# Patient Record
Sex: Female | Born: 1965 | Race: White | Hispanic: No | Marital: Married | Smoking: Current every day smoker
Health system: Southern US, Community
[De-identification: ages and names within clinical notes are randomized; demographics above are authoritative.]

## PROBLEM LIST (undated history)

## (undated) DIAGNOSIS — I10 Essential (primary) hypertension: Secondary | ICD-10-CM

## (undated) DIAGNOSIS — Q211 Atrial septal defect: Secondary | ICD-10-CM

## (undated) DIAGNOSIS — Q2112 Patent foramen ovale: Secondary | ICD-10-CM

## (undated) DIAGNOSIS — R0789 Other chest pain: Secondary | ICD-10-CM

## (undated) DIAGNOSIS — I340 Nonrheumatic mitral (valve) insufficiency: Secondary | ICD-10-CM

## (undated) DIAGNOSIS — I77810 Thoracic aortic ectasia: Secondary | ICD-10-CM

## (undated) DIAGNOSIS — G459 Transient cerebral ischemic attack, unspecified: Secondary | ICD-10-CM

## (undated) DIAGNOSIS — E785 Hyperlipidemia, unspecified: Secondary | ICD-10-CM

## (undated) HISTORY — DX: Other chest pain: R07.89

## (undated) HISTORY — PX: OOPHORECTOMY: SHX86

## (undated) HISTORY — PX: INSERTION OF MESH: SHX5868

## (undated) HISTORY — DX: Thoracic aortic ectasia: I77.810

## (undated) HISTORY — PX: ABDOMINAL HYSTERECTOMY: SHX81

## (undated) HISTORY — DX: Patent foramen ovale: Q21.12

## (undated) HISTORY — PX: ABDOMINAL SURGERY: SHX537

## (undated) HISTORY — PX: ECTOPIC PREGNANCY SURGERY: SHX613

## (undated) HISTORY — DX: Atrial septal defect: Q21.1

## (undated) HISTORY — DX: Transient cerebral ischemic attack, unspecified: G45.9

---
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
## 2000-03-12 ENCOUNTER — Encounter: Payer: Self-pay | Admitting: Emergency Medicine

## 2000-03-12 ENCOUNTER — Emergency Department (HOSPITAL_COMMUNITY): Admission: EM | Admit: 2000-03-12 | Discharge: 2000-03-12 | Payer: Self-pay | Admitting: Emergency Medicine

## 2001-02-27 ENCOUNTER — Emergency Department (HOSPITAL_COMMUNITY): Admission: EM | Admit: 2001-02-27 | Discharge: 2001-02-27 | Payer: Self-pay

## 2001-11-26 ENCOUNTER — Emergency Department (HOSPITAL_COMMUNITY): Admission: EM | Admit: 2001-11-26 | Discharge: 2001-11-26 | Payer: Self-pay | Admitting: Emergency Medicine

## 2001-11-26 ENCOUNTER — Encounter: Payer: Self-pay | Admitting: Emergency Medicine

## 2008-07-10 ENCOUNTER — Ambulatory Visit (HOSPITAL_COMMUNITY): Admission: RE | Admit: 2008-07-10 | Discharge: 2008-07-10 | Payer: Self-pay | Admitting: Cardiology

## 2014-07-08 DIAGNOSIS — G459 Transient cerebral ischemic attack, unspecified: Secondary | ICD-10-CM

## 2014-07-08 HISTORY — DX: Transient cerebral ischemic attack, unspecified: G45.9

## 2014-11-24 ENCOUNTER — Ambulatory Visit: Payer: Self-pay | Admitting: Neurology

## 2014-12-02 ENCOUNTER — Ambulatory Visit: Payer: Self-pay | Admitting: Neurology

## 2014-12-08 ENCOUNTER — Ambulatory Visit: Payer: Self-pay | Admitting: Neurology

## 2014-12-09 ENCOUNTER — Encounter: Payer: Self-pay | Admitting: Neurology

## 2015-06-27 ENCOUNTER — Emergency Department (HOSPITAL_COMMUNITY): Payer: Self-pay

## 2015-06-27 ENCOUNTER — Emergency Department (HOSPITAL_COMMUNITY)
Admission: EM | Admit: 2015-06-27 | Discharge: 2015-06-27 | Disposition: A | Discharge status: Home / Self Care | Payer: Self-pay | Attending: Emergency Medicine | Admitting: Emergency Medicine

## 2015-06-27 ENCOUNTER — Encounter (HOSPITAL_COMMUNITY): Payer: Self-pay

## 2015-06-27 DIAGNOSIS — Z79899 Other long term (current) drug therapy: Secondary | ICD-10-CM | POA: Insufficient documentation

## 2015-06-27 DIAGNOSIS — I10 Essential (primary) hypertension: Secondary | ICD-10-CM | POA: Insufficient documentation

## 2015-06-27 DIAGNOSIS — R109 Unspecified abdominal pain: Secondary | ICD-10-CM

## 2015-06-27 DIAGNOSIS — F17200 Nicotine dependence, unspecified, uncomplicated: Secondary | ICD-10-CM | POA: Insufficient documentation

## 2015-06-27 DIAGNOSIS — Z8673 Personal history of transient ischemic attack (TIA), and cerebral infarction without residual deficits: Secondary | ICD-10-CM | POA: Insufficient documentation

## 2015-06-27 DIAGNOSIS — R1012 Left upper quadrant pain: Secondary | ICD-10-CM | POA: Insufficient documentation

## 2015-06-27 HISTORY — DX: Essential (primary) hypertension: I10

## 2015-06-27 HISTORY — DX: Nonrheumatic mitral (valve) insufficiency: I34.0

## 2015-06-27 LAB — URINALYSIS, ROUTINE W REFLEX MICROSCOPIC
Bilirubin Urine: NEGATIVE
GLUCOSE, UA: NEGATIVE mg/dL
HGB URINE DIPSTICK: NEGATIVE
KETONES UR: NEGATIVE mg/dL
LEUKOCYTES UA: NEGATIVE
Nitrite: NEGATIVE
PROTEIN: NEGATIVE mg/dL
Specific Gravity, Urine: 1.008 (ref 1.005–1.030)
pH: 6.5 (ref 5.0–8.0)

## 2015-06-27 LAB — CBC WITH DIFFERENTIAL/PLATELET
Basophils Absolute: 0 10*3/uL (ref 0.0–0.1)
Basophils Relative: 0 %
Eosinophils Absolute: 0.2 10*3/uL (ref 0.0–0.7)
Eosinophils Relative: 2 %
HEMATOCRIT: 42.6 % (ref 36.0–46.0)
Hemoglobin: 14.3 g/dL (ref 12.0–15.0)
LYMPHS ABS: 2.1 10*3/uL (ref 0.7–4.0)
LYMPHS PCT: 23 %
MCH: 29.5 pg (ref 26.0–34.0)
MCHC: 33.6 g/dL (ref 30.0–36.0)
MCV: 88 fL (ref 78.0–100.0)
MONO ABS: 0.7 10*3/uL (ref 0.1–1.0)
MONOS PCT: 7 %
NEUTROS ABS: 6.3 10*3/uL (ref 1.7–7.7)
Neutrophils Relative %: 68 %
Platelets: 324 10*3/uL (ref 150–400)
RBC: 4.84 MIL/uL (ref 3.87–5.11)
RDW: 13.3 % (ref 11.5–15.5)
WBC: 9.2 10*3/uL (ref 4.0–10.5)

## 2015-06-27 LAB — COMPREHENSIVE METABOLIC PANEL
ALT: 24 U/L (ref 14–54)
AST: 18 U/L (ref 15–41)
Albumin: 4.2 g/dL (ref 3.5–5.0)
Alkaline Phosphatase: 61 U/L (ref 38–126)
Anion gap: 7 (ref 5–15)
BILIRUBIN TOTAL: 0.6 mg/dL (ref 0.3–1.2)
BUN: 10 mg/dL (ref 6–20)
CO2: 27 mmol/L (ref 22–32)
CREATININE: 0.82 mg/dL (ref 0.44–1.00)
Calcium: 9.3 mg/dL (ref 8.9–10.3)
Chloride: 105 mmol/L (ref 101–111)
GFR calc Af Amer: >60 mL/min (ref >60)
Glucose, Bld: 86 mg/dL (ref 65–99)
POTASSIUM: 4 mmol/L (ref 3.5–5.1)
Sodium: 139 mmol/L (ref 135–145)
TOTAL PROTEIN: 7.9 g/dL (ref 6.5–8.1)

## 2015-06-27 LAB — I-STAT CG4 LACTIC ACID, ED: Lactic Acid, Venous: 0.72 mmol/L (ref 0.5–2.0)

## 2015-06-27 LAB — LIPASE, BLOOD: LIPASE: 73 U/L — AB (ref 11–51)

## 2015-06-27 MED ORDER — SODIUM CHLORIDE 0.9 % IV BOLUS (SEPSIS)
500.0000 mL | Freq: Once | INTRAVENOUS | Status: AC
  Administered 2015-06-27: 500 mL via INTRAVENOUS

## 2015-06-27 MED ORDER — HYDROMORPHONE HCL 1 MG/ML IJ SOLN
0.5000 mg | Freq: Once | INTRAMUSCULAR | Status: AC
  Administered 2015-06-27: 0.5 mg via INTRAVENOUS
  Filled 2015-06-27: qty 1

## 2015-06-27 MED ORDER — ONDANSETRON HCL 4 MG/2ML IJ SOLN
4.0000 mg | Freq: Once | INTRAMUSCULAR | Status: AC
  Administered 2015-06-27: 4 mg via INTRAVENOUS
  Filled 2015-06-27: qty 2

## 2015-06-27 MED ORDER — DIATRIZOATE MEGLUMINE & SODIUM 66-10 % PO SOLN: 30.0000 mL | Freq: Once | ORAL | Status: DC

## 2015-06-27 MED ORDER — IOPAMIDOL (ISOVUE-300) INJECTION 61%
100.0000 mL | Freq: Once | INTRAVENOUS | Status: AC | PRN
  Administered 2015-06-27: 100 mL via INTRAVENOUS

## 2015-06-27 NOTE — ED Notes (Signed)
Pt has hx of hernia with mesh.  Pt woke up Friday with abdominal pain.  Yesterday, noted bulging in abdomen with increased pain.  Nausea.  No emesis.

## 2015-06-27 NOTE — Discharge Instructions (Signed)
You were seen and evaluated today for your abdominal pain. The exact cause is unclear at this time. Please follow-up with your primary care physician for reevaluation and further workup.  Abdominal Pain, Adult Many things can cause abdominal pain. Usually, abdominal pain is not caused by a disease and will improve without treatment. It can often be observed and treated at home. Your health care provider will do a physical exam and possibly order blood tests and X-rays to help determine the seriousness of your pain. However, in many cases, more time must pass before a clear cause of the pain can be found. Before that point, your health care provider may not know if you need more testing or further treatment. HOME CARE INSTRUCTIONS Monitor your abdominal pain for any changes. The following actions may help to alleviate any discomfort you are experiencing:  Only take over-the-counter or prescription medicines as directed by your health care provider.  Do not take laxatives unless directed to do so by your health care provider.  Try a clear liquid diet (broth, tea, or water) as directed by your health care provider. Slowly move to a bland diet as tolerated. SEEK MEDICAL CARE IF:  You have unexplained abdominal pain.  You have abdominal pain associated with nausea or diarrhea.  You have pain when you urinate or have a bowel movement.  You experience abdominal pain that wakes you in the night.  You have abdominal pain that is worsened or improved by eating food.  You have abdominal pain that is worsened with eating fatty foods.  You have a fever. SEEK IMMEDIATE MEDICAL CARE IF:  Your pain does not go away within 2 hours.  You keep throwing up (vomiting).  Your pain is felt only in portions of the abdomen, such as the right side or the left lower portion of the abdomen.  You pass bloody or black tarry stools. MAKE SURE YOU:  Understand these instructions.  Will watch your  condition.  Will get help right away if you are not doing well or get worse.   This information is not intended to replace advice given to you by your health care provider. Make sure you discuss any questions you have with your health care provider.   Document Released: 11/02/2004 Document Revised: 10/14/2014 Document Reviewed: 10/02/2012 Elsevier Interactive Patient Education [employer]

## 2015-06-27 NOTE — ED Provider Notes (Signed)
CSN: [id number]     Arrival date & time 06/27/15  1106 History   First MD Initiated Contact with Patient 06/27/15 1208     Chief Complaint  Patient presents with  . Abdominal Pain  . Hernia     (Consider location/radiation/quality/duration/timing/severity/associated sxs/prior Treatment) HPI Comments: 50 year old female with history of multiple abdominal surgeries presents for abdominal pain. The patient reports that over the last 3 or so days she has had pain in her abdomen. The pain has come and gone somewhat. Yesterday she felt the pain increase and thought she had a bulging in her left upper abdomen. She's also had associated nausea without any emesis. She has been passing gas as well as having bowel movements. No fevers or chills. She was discussing these symptoms today with nurse friend at church who said she should come to the emergency department because she could have an incarcerated hernia. She is still eating and drinking without any issue.  Patient is a 50 y.o. female presenting with abdominal pain.  Abdominal Pain Associated symptoms: nausea   Associated symptoms: no chest pain, no chills, no cough, no diarrhea, no dysuria, no fatigue, no fever, no shortness of breath and no vomiting     Past Medical History  Diagnosis Date  . Heart murmur   . Mitral valve regurgitation   . Stroke (HCC)   . Hypertension    Past Surgical History  Procedure Laterality Date  . Abdominal surgery    . Insertion of mesh    . Abdominal hysterectomy    . Ectopic pregnancy surgery    . Oophorectomy     History reviewed. No pertinent family history. Social History  Substance Use Topics  . Smoking status: Current Every Day Smoker  . Smokeless tobacco: None  . Alcohol Use: No   OB History    No data available     Review of Systems  Constitutional: Negative for fever, chills and fatigue.  HENT: Negative for congestion, rhinorrhea and sinus pressure.   Eyes: Negative for visual  disturbance.  Respiratory: Negative for cough, chest tightness and shortness of breath.   Cardiovascular: Negative for chest pain and palpitations.  Gastrointestinal: Positive for nausea and abdominal pain. Negative for vomiting and diarrhea.  Genitourinary: Negative for dysuria, urgency and frequency.  Musculoskeletal: Negative for myalgias and back pain.  Skin: Negative for rash.  Neurological: Negative for dizziness, weakness, light-headedness and headaches.  Hematological: Does not bruise/bleed easily.      Allergies  Aspirin  Home Medications   Prior to Admission medications   Medication Sig Start Date End Date Taking? Authorizing Provider  clopidogrel (PLAVIX) 75 MG tablet Take 75 mg by mouth daily.   Yes Historical Provider, MD  metoprolol tartrate (LOPRESSOR) 25 MG tablet Take 25 mg by mouth 2 (two) times daily.   Yes Historical Provider, MD   BP 115/70 mmHg  Pulse 67  Temp(Src) 98.4 F (36.9 C) (Oral)  Resp 13  SpO2 97% Physical Exam  Constitutional: She is oriented to person, place, and time. She appears well-developed and well-nourished. No distress.  HENT:  Head: Normocephalic and atraumatic.  Right Ear: External ear normal.  Left Ear: External ear normal.  Nose: Nose normal.  Mouth/Throat: Oropharynx is clear and moist. No oropharyngeal exudate.  Eyes: EOM are normal. Pupils are equal, round, and reactive to light.  Neck: Normal range of motion. Neck supple.  Cardiovascular: Normal rate, regular rhythm, normal heart sounds and intact distal pulses.   No murmur heard.  Pulmonary/Chest: Effort normal. No respiratory distress. She has no wheezes. She has no rales.  Abdominal: Soft. Normal appearance and bowel sounds are normal. She exhibits no distension. There is tenderness. There is no CVA tenderness. No hernia. Hernia confirmed negative in the ventral area, confirmed negative in the right inguinal area and confirmed negative in the left inguinal area.     Musculoskeletal: Normal range of motion. She exhibits no edema or tenderness.  Neurological: She is alert and oriented to person, place, and time.  Skin: Skin is warm and dry. No rash noted. She is not diaphoretic.  Vitals reviewed.   ED Course  Procedures (including critical care time) Labs Review Labs Reviewed  LIPASE, BLOOD - Abnormal; Notable for the following:    Lipase 73 (*)    All other components within normal limits  CBC WITH DIFFERENTIAL/PLATELET  COMPREHENSIVE METABOLIC PANEL  URINALYSIS, ROUTINE W REFLEX MICROSCOPIC (NOT AT [facility])  I-STAT CG4 LACTIC ACID, ED  I-STAT CG4 LACTIC ACID, ED    Imaging Review Ct Abdomen Pelvis W Contrast  06/27/2015  CLINICAL DATA:  Abdominal pain since Friday. Abdominal bulging with increased pain. Nausea. Per the emergency room physician, patient describes a palpable bulge in the left upper quadrant. EXAM: CT ABDOMEN AND PELVIS WITH CONTRAST TECHNIQUE: Multidetector CT imaging of the abdomen and pelvis was performed using the standard protocol following bolus administration of intravenous contrast. CONTRAST:  ISOVUE-300 IOPAMIDOL (ISOVUE-300) INJECTION 61% COMPARISON:  None. FINDINGS: Lower chest:  No acute findings. Hepatobiliary: No masses or other significant abnormality. Pancreas: No mass, inflammatory changes, or other significant abnormality. Spleen: Within normal limits in size and appearance. Adrenals/Urinary Tract: No masses identified. No evidence of hydronephrosis. Stomach/Bowel: Fairly extensive diverticulosis within the sigmoid and descending colon but no focal inflammatory change to suggest acute diverticulitis. No large or small bowel dilatation. No evidence of bowel wall inflammation. Appendix is normal. No bowel hernia. Stomach appears normal. Vascular/Lymphatic: Abdominal aorta is normal in caliber. No enlarged lymph nodes seen within the abdomen or pelvis. Reproductive: Status post hysterectomy. Other: No free fluid or  abscess collection seen. No free intraperitoneal air. Musculoskeletal: Mild degenerative change within the slightly scoliotic thoracolumbar spine. No acute osseous abnormality. There is a small periumbilical abdominal wall hernia which contains fat only. Superficial soft tissues are otherwise unremarkable. IMPRESSION: 1. No acute findings. No cause identified for a palpable abnormality in the left upper quadrant. 2. Colonic diverticulosis without evidence of acute diverticulitis. 3. Small periumbilical abdominal wall hernia which contains fat only. No bowel involvement. These results were called by telephone at the time of interpretation on 06/27/2015 at 2:50 pm to Dr. Tyrone Apple , who verbally acknowledged these results. Electronically Signed   By: Bary Richard M.D.   On: 06/27/2015 14:56   I have personally reviewed and evaluated these images and lab results as part of my medical decision-making.   EKG Interpretation None      MDM  Patient was seen and evaluated in stable condition. Lipase mildly elevated but otherwise laboratory studies unremarkable. CT without acute findings. Patient well-appearing with benign abdominal examination. No hernia on examination. Results discussed at bedside with patient and her husband who expressed understanding. She was discharged home in stable condition with instruction to follow-up with her primary care physician. Final diagnoses:  Abdominal pain, unspecified abdominal location    1. Abdominal pain, unknown cause    Leta Baptist, MD 06/27/15 1719

## 2016-03-29 ENCOUNTER — Encounter (INDEPENDENT_AMBULATORY_CARE_PROVIDER_SITE_OTHER): Payer: Self-pay

## 2016-03-29 ENCOUNTER — Encounter: Payer: Self-pay | Admitting: Cardiology

## 2016-03-29 ENCOUNTER — Ambulatory Visit (INDEPENDENT_AMBULATORY_CARE_PROVIDER_SITE_OTHER): Payer: Self-pay | Admitting: Cardiology

## 2016-03-29 DIAGNOSIS — I10 Essential (primary) hypertension: Secondary | ICD-10-CM

## 2016-03-29 DIAGNOSIS — R079 Chest pain, unspecified: Secondary | ICD-10-CM | POA: Insufficient documentation

## 2016-03-29 DIAGNOSIS — I340 Nonrheumatic mitral (valve) insufficiency: Secondary | ICD-10-CM | POA: Insufficient documentation

## 2016-03-29 NOTE — Patient Instructions (Signed)
Medication Instructions:  Your physician recommends that you continue on your current medications as directed. Please refer to the Current Medication list given to you today.   Labwork: None  Testing/Procedures: None  Follow-Up: Your physician recommends that you schedule a follow-up appointment AS NEEDED pending your records we have requested.  Any Other Special Instructions Will Be Listed Below (If Applicable).     If you need a refill on your cardiac medications before your next appointment, please call your pharmacy.

## 2016-03-29 NOTE — Progress Notes (Signed)
Cardiology Office Note    Date:  03/29/2016  ID:  Gloria Cisneros, DOB [birth year], MRN [id number]  PCP:  Kaleen Mask, MD  Cardiologist:  Armanda Magic, MD   Chief Complaint  Patient presents with  . Chest Pain  . Hypertension  . Mitral Regurgitation    History of Present Illness:  Gloria Cisneros is a 51 y.o. female with a history of MR and HTN who presents for evaluation of chest pain.  She recently saw her PCP a month ago and complained of chest pain.  She was told to go to the ER but she did not pursue this any further.  She says that she had been in her USOH until June 2016 and then went to the ER with facial droop after having a tick bite.  She then passed out and never woke up for 3 days in the hospital and was diagnosed with an acute CVA.  Apparently she was diagnosed with a PFO.  After that hospitalization she started having chest pain and she was admitted to [facility] regional and was placed on Plavix and ASA and BP meds.  She had a stress test done which was normal.  She says that she had no further problems with CP until last month.  She was given a new prescription for metoprolol tartrate and started having CP so she switched back to her other pills and her CP resolved and she has not had any further episodes of CP.  She says that she has been having sharp pain in her left neck on occasion.  She is still smoking but is down to 1/4ppd. When she had the CP she described it as a sharp pain associated with nausea and dizziness.  She has a chronic cough from smoking and denies any DOE except with extreme exertion.  She tries to walk daily to try to get her weight down.  She occasionally has some mild LE edema.  She denies any palpitations.      Past Medical History:  Diagnosis Date  . Heart murmur   . Hypertension   . Mitral valve regurgitation   . Stroke [facility])     Past Surgical History:  Procedure Laterality Date  . ABDOMINAL HYSTERECTOMY    . ABDOMINAL SURGERY    . ECTOPIC  PREGNANCY SURGERY    . INSERTION OF MESH    . OOPHORECTOMY      Current Medications: Current Meds  Medication Sig  . clopidogrel (PLAVIX) 75 MG tablet Take 75 mg by mouth daily.  . metoprolol tartrate (LOPRESSOR) 25 MG tablet Take 25 mg by mouth 2 (two) times daily.  . nitroGLYCERIN (NITROSTAT) 0.4 MG SL tablet Place 0.4 mg under the tongue.    Allergies:   Aspirin; Coconut oil; and Salvia officinalis   Social History   Social History  . Marital status: Married    Spouse name: N/A  . Number of children: N/A  . Years of education: N/A   Social History Main Topics  . Smoking status: Current Every Day Smoker  . Smokeless tobacco: Never Used  . Alcohol use No  . Drug use: No  . Sexual activity: Not Asked   Other Topics Concern  . None   Social History Narrative  . None     Family History:  The patient's family history includes Hyperlipidemia in her mother; Hypertension in her father.   ROS:   Please see the history of present illness.    ROS All other  systems reviewed and are negative.  No flowsheet data found.     PHYSICAL EXAM:   VS:  BP 130/84   Pulse 73   Ht 5\' 3"  (1.6 m)   Wt 222 lb 12.8 oz (101.1 kg)   BMI 39.47 kg/m    GEN: Well nourished, well developed, in no acute distress  HEENT: normal  Neck: no JVD, carotid bruits, or masses Cardiac: RRR; no murmurs, rubs, or gallops,no edema.  Intact distal pulses bilaterally.  Respiratory:  clear to auscultation bilaterally, normal work of breathing GI: soft, nontender, nondistended, + BS MS: no deformity or atrophy  Skin: warm and dry, no rash Neuro:  Alert and Oriented x 3, Strength and sensation are intact Psych: euthymic mood, full affect  Wt Readings from Last 3 Encounters:  03/29/16 222 lb 12.8 oz (101.1 kg)      Studies/Labs Reviewed:   EKG:  EKG is ordered today.  The ekg ordered today demonstrates NSR at 73bpm with no ST changes.  Recent Labs: 06/27/2015: ALT 24; BUN 10; Creatinine, Ser  0.82; Hemoglobin 14.3; Platelets 324; Potassium 4.0; Sodium 139   Lipid Panel No results found for: CHOL, TRIG, HDL, CHOLHDL, VLDL, LDLCALC, LDLDIRECT  Additional studies/ records that were reviewed today include:  Office visit notes.     ASSESSMENT:    1. Chest pain, unspecified type   2. Mitral valve insufficiency, unspecified etiology   3. Benign essential HTN      PLAN:  In order of problems listed above:  1. Chest pain - this is very atypical and stopped after she switched brands of her metoprolol.  I do not have any of her prior cardiac workup over the past year which she states was extensive including stress testing, labs stating she had had an MI and echo.  Before any other tests are ordered, I will try to get copies of her hospital admits at [facility] as well as Cardiology office notes. 2. MR by echo remotely - will get a copy of most recent echo.  3. HTN - BP controlled on current meds.  She will continue on BB.      Medication Adjustments/Labs and Tests Ordered: Current medicines are reviewed at length with the patient today.  Concerns regarding medicines are outlined above.  Medication changes, Labs and Tests ordered today are listed in the Patient Instructions below.  There are no Patient Instructions on file for this visit.   Signed, Armanda Magic, MD  03/29/2016 1:58 PM    [facility] [address] [place], [place], [place]  [address] Phone: [phone]; Fax: [phone]

## 2016-04-11 ENCOUNTER — Other Ambulatory Visit: Payer: Self-pay | Admitting: Cardiology

## 2016-04-11 MED ORDER — METOPROLOL TARTRATE 25 MG PO TABS: 25.0000 mg | ORAL_TABLET | Freq: Two times a day (BID) | ORAL | 3 refills | Status: DC

## 2016-04-12 ENCOUNTER — Encounter: Payer: Self-pay | Admitting: Cardiology

## 2016-04-12 DIAGNOSIS — Q2112 Patent foramen ovale: Secondary | ICD-10-CM | POA: Insufficient documentation

## 2016-04-12 DIAGNOSIS — Q211 Atrial septal defect: Secondary | ICD-10-CM | POA: Insufficient documentation

## 2016-05-05 ENCOUNTER — Emergency Department (HOSPITAL_COMMUNITY): Payer: Self-pay

## 2016-05-05 ENCOUNTER — Encounter (HOSPITAL_COMMUNITY): Payer: Self-pay | Admitting: Nurse Practitioner

## 2016-05-05 ENCOUNTER — Telehealth: Payer: Self-pay | Admitting: Cardiology

## 2016-05-05 ENCOUNTER — Emergency Department (HOSPITAL_COMMUNITY)
Admission: EM | Admit: 2016-05-05 | Discharge: 2016-05-06 | Disposition: A | Discharge status: Home / Self Care | Payer: Self-pay | Attending: Emergency Medicine | Admitting: Emergency Medicine

## 2016-05-05 DIAGNOSIS — Z79899 Other long term (current) drug therapy: Secondary | ICD-10-CM | POA: Insufficient documentation

## 2016-05-05 DIAGNOSIS — R55 Syncope and collapse: Secondary | ICD-10-CM | POA: Insufficient documentation

## 2016-05-05 DIAGNOSIS — R072 Precordial pain: Secondary | ICD-10-CM | POA: Insufficient documentation

## 2016-05-05 DIAGNOSIS — Z8673 Personal history of transient ischemic attack (TIA), and cerebral infarction without residual deficits: Secondary | ICD-10-CM | POA: Insufficient documentation

## 2016-05-05 DIAGNOSIS — F17200 Nicotine dependence, unspecified, uncomplicated: Secondary | ICD-10-CM | POA: Insufficient documentation

## 2016-05-05 DIAGNOSIS — I10 Essential (primary) hypertension: Secondary | ICD-10-CM | POA: Insufficient documentation

## 2016-05-05 DIAGNOSIS — R609 Edema, unspecified: Secondary | ICD-10-CM

## 2016-05-05 DIAGNOSIS — R0602 Shortness of breath: Secondary | ICD-10-CM | POA: Insufficient documentation

## 2016-05-05 DIAGNOSIS — I252 Old myocardial infarction: Secondary | ICD-10-CM | POA: Insufficient documentation

## 2016-05-05 DIAGNOSIS — B353 Tinea pedis: Secondary | ICD-10-CM | POA: Insufficient documentation

## 2016-05-05 LAB — CBC
HCT: 37.8 % (ref 36.0–46.0)
HEMOGLOBIN: 12.6 g/dL (ref 12.0–15.0)
MCH: 29 pg (ref 26.0–34.0)
MCHC: 33.3 g/dL (ref 30.0–36.0)
MCV: 87.1 fL (ref 78.0–100.0)
Platelets: 300 10*3/uL (ref 150–400)
RBC: 4.34 MIL/uL (ref 3.87–5.11)
RDW: 12.6 % (ref 11.5–15.5)
WBC: 7.3 10*3/uL (ref 4.0–10.5)

## 2016-05-05 LAB — BASIC METABOLIC PANEL
ANION GAP: 7 (ref 5–15)
BUN: 10 mg/dL (ref 6–20)
CALCIUM: 9.2 mg/dL (ref 8.9–10.3)
CO2: 27 mmol/L (ref 22–32)
CREATININE: 0.77 mg/dL (ref 0.44–1.00)
Chloride: 108 mmol/L (ref 101–111)
Glucose, Bld: 122 mg/dL — ABNORMAL HIGH (ref 65–99)
Potassium: 3.6 mmol/L (ref 3.5–5.1)
SODIUM: 142 mmol/L (ref 135–145)

## 2016-05-05 LAB — I-STAT TROPONIN, ED: Troponin i, poc: 0 ng/mL (ref 0.00–0.08)

## 2016-05-05 LAB — HEPATIC FUNCTION PANEL
ALBUMIN: 3.6 g/dL (ref 3.5–5.0)
ALK PHOS: 48 U/L (ref 38–126)
ALT: 29 U/L (ref 14–54)
AST: 22 U/L (ref 15–41)
Bilirubin, Direct: 0.2 mg/dL (ref 0.1–0.5)
Indirect Bilirubin: 0.5 mg/dL (ref 0.3–0.9)
TOTAL PROTEIN: 6.8 g/dL (ref 6.5–8.1)
Total Bilirubin: 0.7 mg/dL (ref 0.3–1.2)

## 2016-05-05 MED ORDER — IOPAMIDOL (ISOVUE-370) INJECTION 76%
INTRAVENOUS | Status: AC
  Administered 2016-05-05: 100 mL
  Filled 2016-05-05: qty 100

## 2016-05-05 NOTE — ED Triage Notes (Signed)
Pt presents with c/o CP. The CP began about 2 weeks ago while she was being treated for the flu with prednisone. The pain is intermittent in the left side of her chest. She reports fevers, nausea, sob, cough, all which have persisted since she was diagnosed with flu.

## 2016-05-05 NOTE — Telephone Encounter (Signed)
Received a call from patient she stated she had the flu 2 weeks ago.Stated all week she has been having sob,chest pain,dizziness,nausea.Stated she don't feel right.Stated she is suppose to leave for [place] on Monday and wanted to be seen.Advised she needs to go to [facility] ED.Trish notified.

## 2016-05-05 NOTE — Telephone Encounter (Signed)
New Message    Pt c/o Shortness Of Breath: STAT if SOB developed within the last 24 hours or pt is noticeably SOB on the phone  1. Are you currently SOB (can you hear that pt is SOB on the phone)?  No  2. How long have you been experiencing SOB? A week  3. Are you SOB when sitting or when up moving around? Both   4. Are you currently experiencing any other symptoms? Dizziness, nausea, shortness of breath, pt had the flu last week and this has been happening every since , cant smoke cigarettes or drink soda, she gets chest pains and cant catch her breath

## 2016-05-05 NOTE — ED Notes (Signed)
Pt refuses to be hooked up on cardiac monitor, pt states that she has not had CP in three days and only came because her cardiologist told her too. Pt states she does not want extra blood work, just to go home.

## 2016-05-06 LAB — BRAIN NATRIURETIC PEPTIDE: B Natriuretic Peptide: 33.5 pg/mL (ref 0.0–100.0)

## 2016-05-06 MED ORDER — CLOTRIMAZOLE 1 % EX CREA: TOPICAL_CREAM | CUTANEOUS | 0 refills | Status: DC

## 2016-05-06 NOTE — ED Provider Notes (Signed)
[facility]-EMERGENCY DEPT Provider Note   CSN: [id number] Arrival date & time: 05/05/16  1904     History   Chief Complaint Chief Complaint  Patient presents with  . Chest Pain    HPI Gloria Cisneros is a 51 y.o. female with a hx of Mitral valve regurgitation, PFO, TIA, hypertension, longtime smoker presents to the Emergency Department complaining of waxing and waning chest pressure with associated episodes of near syncope and lightheadedness onset 2 weeks ago after being diagnosed with influenza. Patient reports that she was given prednisone and spent an entire week in bed. After that she noted swelling of the bilateral lower extremities and recurrent episodes of dyspnea on exertion and near syncope. She reports she is only smoking approximately 3 cigarettes per day at this time due to her illness. She denies history of DVT though has a history of TIA and is taking Plavix. She also reports approximately 25 pound weight gain in the last 2 weeks. She denies previous history of heart failure, leg swelling.  She has reported a right calf pain over the last week.  The history is provided by the patient and medical records. No language interpreter was used.    Past Medical History:  Diagnosis Date  . Heart murmur   . Hypertension   . MI (mitral incompetence)    noted in office notes from PCP that no evidence of myocardial injury in the past and likely has not had an Mi in the past.  . Mitral valve regurgitation   . PFO (patent foramen ovale)    by echo 07/2014  . TIA (transient ischemic attack) 07/2014    Patient Active Problem List   Diagnosis Date Noted  . PFO (patent foramen ovale)   . MI (mitral incompetence)   . Chest pain 03/29/2016  . Mitral regurgitation 03/29/2016  . Benign essential HTN 03/29/2016    Past Surgical History:  Procedure Laterality Date  . ABDOMINAL HYSTERECTOMY    . ABDOMINAL SURGERY    . ECTOPIC PREGNANCY SURGERY    . INSERTION OF MESH    . OOPHORECTOMY       OB History    No data available       Home Medications    Prior to Admission medications   Medication Sig Start Date End Date Taking? Authorizing Provider  clopidogrel (PLAVIX) 75 MG tablet Take 75 mg by mouth daily.   Yes Historical Provider, MD  ibuprofen (ADVIL,MOTRIN) 200 MG tablet Take 200-600 mg by mouth every 6 (six) hours as needed for moderate pain.   Yes Historical Provider, MD  metoprolol tartrate (LOPRESSOR) 25 MG tablet Take 1 tablet (25 mg total) by mouth 2 (two) times daily. 04/11/16  Yes Quintella Reichert, MD  nitroGLYCERIN (NITROSTAT) 0.4 MG SL tablet Place 0.4 mg under the tongue every 5 (five) minutes as needed for chest pain.  06/16/14  Yes Historical Provider, MD  clotrimazole (LOTRIMIN) 1 % cream Apply to affected area 2 times daily 05/06/16   Dahlia Client Kazandra Forstrom, PA-C    Family History Family History  Problem Relation Age of Onset  . Hyperlipidemia Mother   . Hypertension Father     Social History Social History  Substance Use Topics  . Smoking status: Current Every Day Smoker  . Smokeless tobacco: Never Used  . Alcohol use No     Allergies   Aspirin; Coconut oil; and Salvia officinalis   Review of Systems Review of Systems  Respiratory: Positive for chest tightness and shortness  of breath.   Cardiovascular: Positive for chest pain and leg swelling.  All other systems reviewed and are negative.    Physical Exam Updated Vital Signs BP 133/90   Pulse 69   Temp 98.8 F (37.1 C) (Oral)   Resp 20   SpO2 98%   Physical Exam  Constitutional: She appears well-developed and well-nourished. No distress.  Awake, alert, nontoxic appearance  HENT:  Head: Normocephalic and atraumatic.  Mouth/Throat: Oropharynx is clear and moist. No oropharyngeal exudate.  Eyes: Conjunctivae are normal. No scleral icterus.  Neck: Normal range of motion. Neck supple.  Cardiovascular: Normal rate, regular rhythm and intact distal pulses.   Pulmonary/Chest: Effort  normal and breath sounds normal. No respiratory distress. She has no wheezes.  Equal chest expansion  Abdominal: Soft. Bowel sounds are normal. She exhibits no mass. There is no tenderness. There is no rebound and no guarding.  Musculoskeletal: Normal range of motion. She exhibits edema ( 1+ pitting edema bilateral lower extremities, worst around the ankles).  Neurological: She is alert.  Speech is clear and goal oriented Moves extremities without ataxia  Skin: Skin is warm and dry. She is not diaphoretic.  Right front with scaling of the sole, no erythema, induration or drainage.  Psychiatric: She has a normal mood and affect.  Nursing note and vitals reviewed.    ED Treatments / Results  Labs (all labs ordered are listed, but only abnormal results are displayed) Labs Reviewed  BASIC METABOLIC PANEL - Abnormal; Notable for the following:       Result Value   Glucose, Bld 122 (*)    All other components within normal limits  CBC  BRAIN NATRIURETIC PEPTIDE  HEPATIC FUNCTION PANEL  I-STAT TROPOININ, ED    EKG  EKG Interpretation  Date/Time:  Friday May 05 2016 19:10:19 EDT Ventricular Rate:  79 PR Interval:  174 QRS Duration: 88 QT Interval:  376 QTC Calculation: 431 R Axis:   71 Text Interpretation:  Normal sinus rhythm Normal ECG No old tracing to compare Confirmed by Erroll Luna [phone]) on 05/06/2016 1:11:12 AM        Radiology Dg Chest 2 View  Result Date: 05/05/2016 CLINICAL DATA:  Chest pain, fever, nausea EXAM: CHEST  2 VIEW COMPARISON:  06/05/2014 FINDINGS: There is a calcified left lower lobe pulmonary nodule unchanged from multiple prior exams. There is no focal parenchymal opacity. There is no pleural effusion or pneumothorax. The heart and mediastinal contours are unremarkable. The osseous structures are unremarkable. IMPRESSION: No active cardiopulmonary disease. Electronically Signed   By: Elige Ko   On: 05/05/2016 19:44   Ct Angio Chest Pe  W Or Wo Contrast  Result Date: 05/06/2016 CLINICAL DATA:  Chest pain and shortness of breath for 2 weeks. Near syncope. EXAM: CT ANGIOGRAPHY CHEST WITH CONTRAST TECHNIQUE: Multidetector CT imaging of the chest was performed using the standard protocol during bolus administration of intravenous contrast. Multiplanar CT image reconstructions and MIPs were obtained to evaluate the vascular anatomy. CONTRAST:  80 cc Isovue 370 IV COMPARISON:  Chest radiograph earlier this day. FINDINGS: Cardiovascular: There are no filling defects within the pulmonary arteries to suggest pulmonary embolus. Mild fusiform aneurysmal dilatation of the ascending aorta, maximal dimension 4.1 cm. No evidence of dissection allowing for phase of contrast. The heart is normal in size. Mediastinum/Nodes: Calcified left hilar nodes, no noncalcified adenopathy. Thyroid gland is normal. The esophagus is decompressed. No pericardial fluid. Lungs/Pleura: No consolidation. Central bronchial thickening. Calcified granuloma in  the left lower lobe. No pleural effusion. Tiny nodules in the right upper lobe, image 49 and 67 series 7. Upper Abdomen: No acute abnormality.  Tiny hiatal hernia. Musculoskeletal: There are no acute or suspicious osseous abnormalities. Review of the MIP images confirms the above findings. IMPRESSION: 1. No pulmonary embolus. 2. Bronchial thickening. 3. Incidental findings include mild thoracic aortic aneurysm (4.1 cm) and tiny right upper lobe pulmonary nodules. 4. Follow-up recommendations as follows: Recommend annual imaging followup by CTA or MRA for thoracic aortic aneurysm. This recommendation follows 2010 ACCF/AHA/AATS/ACR/ASA/SCA/SCAI/SIR/STS/SVM Guidelines for the Diagnosis and Management of Patients with Thoracic Aortic Disease. Circulation. 2010; 121: e266-e369 5. Tiny right upper lobe pulmonary nodules, of doubtful clinical significance, no follow-up needed if patient is low-risk (and has no known or suspected primary  neoplasm). Non-contrast chest CT can be considered in 12 months if patient is high-risk. This recommendation follows the consensus statement: Guidelines for Management of Incidental Pulmonary Nodules Detected on CT Images: From the Fleischner Society 2017; Radiology 2017; 284:228-243. Electronically Signed   By: Rubye Oaks M.D.   On: 05/06/2016 00:09    Procedures Procedures (including critical care time)  Medications Ordered in ED Medications  iopamidol (ISOVUE-370) 76 % injection (100 mLs  Contrast Given 05/05/16 2341)     Initial Impression / Assessment and Plan / ED Course  I have reviewed the triage vital signs and the nursing notes.  Pertinent labs & imaging results that were available during my care of the patient were reviewed by me and considered in my medical decision making (see chart for details).     Patient presents with intermittent chest pain and shortness of breath the last 2 weeks. Increased risk for DVT due to periods of immobilization, chronic smoking and bilateral leg swelling with right calf tenderness. CT scan shows no evidence of PE. Incidental finding of mild thoracic aortic aneurysm is noted along with pulmonary nodules. Results printed and given to patient along with discussion of appropriate follow-up. She states understanding and is in agreement with the plan. No wheezing on exam; No evidence of COPD exacerbation. EKG without acute abnormalities and troponin negative. Highly doubt ACS as patient has been chest pain-free for 72 hours.  Patient with history consistent with fluid overload however no evidence of such on chest x-ray, CT scan and normal BNP. Discussed decrease salt intake and elevation. She is to follow with cardiology, Dr. Mayford Knife.  She also with rash to her right foot consistent with tinea pedis. Topical antifungals given. No evidence of secondary infection.    Final Clinical Impressions(s) / ED Diagnoses   Final diagnoses:  Precordial pain    SOB (shortness of breath)  Near syncope  Peripheral edema  Tinea pedis of right foot    New Prescriptions New Prescriptions   CLOTRIMAZOLE (LOTRIMIN) 1 % CREAM    Apply to affected area 2 times daily       Dierdre Forth, PA-C 05/06/16 0113    Margarita Grizzle, MD 05/06/16 2342

## 2016-05-06 NOTE — Discharge Instructions (Signed)
1. Medications: usual home medications 2. Treatment: rest, drink plenty of fluids, elevate legs 3. Follow Up: Please followup with your primary doctor and Cardiology in 3-5 days for discussion of your diagnoses and further evaluation after today's visit; if you do not have a primary care doctor use the resource guide provided to find one; Please return to the ER for worsening symptoms, full syncope or other concerns.

## 2016-05-11 ENCOUNTER — Telehealth: Payer: Self-pay

## 2016-05-11 DIAGNOSIS — R079 Chest pain, unspecified: Secondary | ICD-10-CM

## 2016-05-11 NOTE — Telephone Encounter (Signed)
Gloria Cisneros D - 05/05/16 << Less Detail',event)" href="javascript:;"><< Less Detail    Quintella Reichert, MD  Sent: Tue May 09, 2016 4:21 PM  To: Henrietta Dine, RN            Message   Please order a coronary CTA with morpholgy and calcium score as well was FFR for chest pain. She was seen in the EF with CP recently.    Robbi Garter  ----- Message -----  From: Letha Cape, RN  Sent: 05/06/2016  1:17 AM  To: Quintella Reichert, MD     Patient agrees to coronary CTA and test ordered for scheduling. Message sent to Billing to contact patient as she is self-pay. Patient was grateful for call.

## 2016-05-15 ENCOUNTER — Encounter: Payer: Self-pay | Admitting: Cardiology

## 2016-05-15 NOTE — Telephone Encounter (Signed)
CT has been scheduled 4/17.

## 2016-05-23 ENCOUNTER — Ambulatory Visit (HOSPITAL_COMMUNITY)
Admission: RE | Admit: 2016-05-23 | Discharge: 2016-05-23 | Disposition: A | Discharge status: Home / Self Care | Payer: Self-pay | Source: Ambulatory Visit | Attending: Cardiology | Admitting: Cardiology

## 2016-05-23 ENCOUNTER — Encounter (HOSPITAL_COMMUNITY): Payer: Self-pay

## 2016-05-23 ENCOUNTER — Telehealth: Payer: Self-pay

## 2016-05-23 DIAGNOSIS — R079 Chest pain, unspecified: Secondary | ICD-10-CM

## 2016-05-23 DIAGNOSIS — I77810 Thoracic aortic ectasia: Secondary | ICD-10-CM | POA: Insufficient documentation

## 2016-05-23 DIAGNOSIS — I700 Atherosclerosis of aorta: Secondary | ICD-10-CM | POA: Insufficient documentation

## 2016-05-23 MED ORDER — NITROGLYCERIN 0.4 MG SL SUBL
SUBLINGUAL_TABLET | SUBLINGUAL | Status: AC
  Administered 2016-05-23: 0.8 mg
  Filled 2016-05-23: qty 2

## 2016-05-23 MED ORDER — METOPROLOL TARTRATE 5 MG/5ML IV SOLN
INTRAVENOUS | Status: AC
  Administered 2016-05-23: 5 mg
  Filled 2016-05-23: qty 10

## 2016-05-23 MED ORDER — IOPAMIDOL (ISOVUE-370) INJECTION 76%
INTRAVENOUS | Status: AC
  Administered 2016-05-23: 80 mL
  Filled 2016-05-23: qty 100

## 2016-05-23 MED ORDER — METOPROLOL TARTRATE 5 MG/5ML IV SOLN
5.0000 mg | Freq: Two times a day (BID) | INTRAVENOUS | Status: DC
  Administered 2016-05-23: 5 mg via INTRAVENOUS

## 2016-05-23 NOTE — Telephone Encounter (Signed)
Informed patient of results and verbal understanding expressed.  ECHO ordered for scheduling. Patient states she knows she has a hole in her heart that will be seen. 1 year recall placed. Patient agrees with treatment plan.

## 2016-05-23 NOTE — Telephone Encounter (Signed)
-----   Message from Quintella Reichert, MD sent at 05/23/2016 12:40 PM EDT ----- Normal coronary arteries with mildly dilated aortic root - check 2D echo to make sure aortic root dimension is equivalent and if so can follow with yearly echo.  Make sure he has another echo pending in 1 year

## 2016-05-23 NOTE — Progress Notes (Signed)
Discharged to home with husband vs stable.

## 2016-05-29 ENCOUNTER — Other Ambulatory Visit: Payer: Self-pay

## 2016-05-29 ENCOUNTER — Ambulatory Visit (HOSPITAL_COMMUNITY): Payer: Self-pay | Attending: Cardiology

## 2016-05-29 DIAGNOSIS — Z720 Tobacco use: Secondary | ICD-10-CM | POA: Insufficient documentation

## 2016-05-29 DIAGNOSIS — I77810 Thoracic aortic ectasia: Secondary | ICD-10-CM | POA: Insufficient documentation

## 2016-05-29 DIAGNOSIS — I517 Cardiomegaly: Secondary | ICD-10-CM | POA: Insufficient documentation

## 2016-05-30 ENCOUNTER — Encounter: Payer: Self-pay | Admitting: Cardiology

## 2016-06-09 ENCOUNTER — Telehealth: Payer: Self-pay | Admitting: Cardiology

## 2016-06-09 DIAGNOSIS — I714 Abdominal aortic aneurysm, without rupture, unspecified: Secondary | ICD-10-CM

## 2016-06-09 NOTE — Telephone Encounter (Signed)
New Message   pt verbalized that she is calling for rn to give her results

## 2016-06-09 NOTE — Telephone Encounter (Signed)
Informed patient of results and verbal understanding expressed.  Repeat ECHO ordered to be scheduled in 1 year. Patient agrees with treatment plan. 

## 2016-06-09 NOTE — Telephone Encounter (Signed)
-----   Message from Quintella Reichert, MD sent at 05/30/2016 10:27 AM EDT ----- Echo showed mild LVH with normal LVF and increased stiffness of heart muscle, mildly dilated ascending aorta - repeat echo in 1 year for dilated aorta

## 2017-01-02 ENCOUNTER — Encounter: Payer: Self-pay | Admitting: Neurology

## 2017-01-02 ENCOUNTER — Ambulatory Visit: Payer: Self-pay | Admitting: Neurology

## 2017-01-02 VITALS — BP 145/94 | HR 81 | Ht 65.0 in | Wt 237.0 lb

## 2017-01-02 DIAGNOSIS — R200 Anesthesia of skin: Secondary | ICD-10-CM

## 2017-01-02 DIAGNOSIS — G35 Multiple sclerosis: Secondary | ICD-10-CM

## 2017-01-02 DIAGNOSIS — R29898 Other symptoms and signs involving the musculoskeletal system: Secondary | ICD-10-CM

## 2017-01-02 DIAGNOSIS — R29818 Other symptoms and signs involving the nervous system: Secondary | ICD-10-CM

## 2017-01-02 DIAGNOSIS — H5462 Unqualified visual loss, left eye, normal vision right eye: Secondary | ICD-10-CM

## 2017-01-02 DIAGNOSIS — R202 Paresthesia of skin: Secondary | ICD-10-CM

## 2017-01-02 DIAGNOSIS — G373 Acute transverse myelitis in demyelinating disease of central nervous system: Secondary | ICD-10-CM

## 2017-01-02 NOTE — Progress Notes (Signed)
[facility]    Provider:  Dr Lucia GaskinsAhern Referring Provider: Kaleen MaskElkins, Wilson Cisneros, * Primary Care Physician:  Gloria MaskElkins, Wilson Oliver, MD  CC:  Multiple neurologic complaints  HPI:  Gloria Cisneros is a 51 y.o. female here as a referral from Dr. Jeannetta NapElkins for numbness, electrical feeling, decrease in memory, blurred vision.  PMHx MVR, PFO, TIA, HTN, long-time smoker. She has had a TIA in the past. She is on Plavix. 3-4 weeks after her TIA she had a "mild heart heart" x 2. Since then her fingers on her right hand goes numb up to her elbow. If she gets tired her "face falls" on the left side without repeat synptoms. A week ago she had a electrocuted feeling. She was not touching anything. She was getting ready to go to bed, and she had an "entire body" electrical shock, from the top of her head to her feet, included the entrie scalp. Then she had aching of muscles, acute. It was like her organs were affected. An hour afterwards she felt better. Then she noticed numb spots above her right eye, left lip, fingers of the right hand, left thumb and right lower leg outer is numb. Acute onset. She still has shocks go through her arm or leg. She feels weak since it happened. She is having random events such as her left arm jerking. Unfortunately she is self pay. She has memory changes, she can;t count change, difficulty writing checks. Since her "sroke" (Diagnosed a TIA). 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 Sheis still smoking.   Reviewed notes, labs and imaging from outside physicians, which showed:  Cbc and bmp unremarkable  Reviewed notes, patient was seen in the emergency room earlier this year in March for feelings of chest pressure with associated episodes of near syncope and lightheadedness in the setting of influenza.  She has a history of TIA and is taking Plavix.  Exam was significant for +1 pitting edema bilaterally around the lower extremities.   Neurologic exam is normal.  CT scan showed no evidence of PE, EKG was without acute abnormalities and troponin negative, no pulmonary edema, was told to follow-up with cardiology.  Cardiology found normal coronary arteries with mildly dilated aortic root, echocardiogram was ordered.  Echo showed LVH with normal LVEF  Review [facility] notes which includes electrical current through her entire body.  Number feeling in different parts of her body and sore muscles and weakness.  Pain in her lower back that radiates to the hips.  Decrease in memory.  Notes handwritten difficult to interpret.  Review of Systems: Patient complains of symptoms per HPI as well as the following symptoms: Weight gain, fatigue, cough, swelling in legs, memory loss, headache, numbness, weakness, dizziness, tremor. Pertinent negatives and positives per HPI. All others negative.   Social History   Socioeconomic History  . Marital status: Married    Spouse name: Not on file  . Number of children: 1  . Years of education: Not on file  . Highest education level: Some college, no degree  Social Needs  . Financial resource strain: Not on file  . Food insecurity - worry: Not on file  . Food insecurity - inability: Not on file  . Transportation needs - medical: Not on file  . Transportation needs - non-medical: Not on file  Occupational History  . Not on file  Tobacco Use  . Smoking status: Current Every Day Smoker  Packs/day: 0.50    Types: Cigarettes  . Smokeless tobacco: Never Used  Substance and Sexual Activity  . Alcohol use: Yes    Comment: 0.5 beer every 6-7 months (2 oz roughly)  . Drug use: No  . Sexual activity: Not on file  Other Topics Concern  . Not on file  Social History Narrative   Lives at home with husband, son moving back in soon   Right handed   3 cups of caffeine daily     Family History  Problem Relation Age of Onset  . Hyperlipidemia Mother   . Hypertension Father    . Cancer Brother     Past Medical History:  Diagnosis Date  . Dilated aortic root (HCC)    4cm by echo 05/2016  . Heart murmur   . Hypertension   . MI (mitral incompetence)    noted in office notes from PCP that no evidence of myocardial injury in the past and likely has not had an Mi in the past.  . Mitral valve regurgitation   . PFO (patent foramen ovale)    by echo 07/2014  . TIA (transient ischemic attack) 07/2014    Past Surgical History:  Procedure Laterality Date  . ABDOMINAL HYSTERECTOMY    . ABDOMINAL SURGERY    . ECTOPIC PREGNANCY SURGERY    . INSERTION OF MESH    . OOPHORECTOMY      Current Outpatient Medications  Medication Sig Dispense Refill  . clopidogrel (PLAVIX) 75 MG tablet Take 75 mg by mouth daily.    . metoprolol tartrate (LOPRESSOR) 25 MG tablet Take 1 tablet (25 mg total) by mouth 2 (two) times daily. 180 tablet 3  . nitroGLYCERIN (NITROSTAT) 0.4 MG SL tablet Place 0.4 mg under the tongue every 5 (five) minutes as needed for chest pain.      No current facility-administered medications for this visit.     Allergies as of 01/02/2017 - Review Complete 01/02/2017  Allergen Reaction Noted  . Aspirin Swelling 06/15/2014  . Coconut oil Anaphylaxis 06/15/2014  . Salvia officinalis Anaphylaxis 06/16/2014    Vitals: BP (!) 145/94 (BP Location: Right Arm, Patient Position: Sitting)   Pulse 81   Ht 5\' 5"  (1.651 m)   Wt 237 lb (107.5 kg)   BMI 39.44 kg/m  Last Weight:  Wt Readings from Last 1 Encounters:  01/02/17 237 lb (107.5 kg)   Last Height:   Ht Readings from Last 1 Encounters:  01/02/17 5\' 5"  (1.651 m)   Physical exam: Exam: Gen: NAD, conversant, well nourised, obese, well groomed                     CV: RRR, no MRG. No Carotid Bruits. No peripheral edema, warm, nontender Eyes: Conjunctivae clear without exudates or hemorrhage  Neuro: Detailed Neurologic Exam  Speech:    Speech is normal; fluent and spontaneous with normal  comprehension.  Cognition:    The patient is oriented to person, place, and time;     recent and remote memory intact;     language fluent;     normal attention, concentration,     fund of knowledge Cranial Nerves:    The pupils are equal, round, and reactive to light. The fundi are normal and spontaneous venous pulsations are present. Visual fields are full to finger confrontation. Extraocular movements are intact. Trigeminal sensation is intact and the muscles of mastication are normal. The face is symmetric. The palate elevates in the midline.  Hearing intact. Voice is normal. Shoulder shrug is normal. The tongue has normal motion without fasciculations.   Coordination:    Normal finger to nose and heel to shin. Normal rapid alternating movements.   Gait:    Heel-toe and gait are normal.   Motor Observation:    No asymmetry, no atrophy, and no involuntary movements noted. Tone:    Normal muscle tone.    Posture:    Posture is normal. normal erect    Strength: Right prox leg weakness with giveway, otherwise strength is V/V in the upper and lower limbs.      Sensation: intact to LT     Reflex Exam:  DTR's:    Deep tendon reflexes in the upper and lower extremities are normal bilaterally.   Toes:    The toes are downgoing bilaterally.   Clonus:    Clonus is absent.       Assessment/Plan:  Patient with multiple neurologic complaints including vision changes, electric shocks throughout entire body from head to feet, weakness, muscle pain, numbness and paresthesias, decreased memory, TIAs, left eye vision loss. Primary care suspects MS. Neuro exam is unremarkable except for right prox leg weakness. Reflexes normal and symmetric.  Can order MRI of the brain and cervical spine w/wo contrast to evaluate for MS, lesions, transverse myelitis or other diffuse processes that may cause patient's symptoms Also labs including CMP, CBC, TSH, CK She is uninsured and can't afford  evaluation, my staf helped her fill out [facility] financial assistance forms today Emg/ncs bilateral uppers and right leg For worsening symptoms or any new concerning symptoms please go to the emergency room or call 911.  Cc: Dr. Jeannetta Cisneros  Orders Placed This Encounter  Procedures  . MR BRAIN W WO CONTRAST  . MR CERVICAL SPINE W WO CONTRAST  . Comprehensive metabolic panel  . CBC  . CK  . NCV with EMG(electromyography)     Gloria Dean, MD  [facility] [address] [place], [place] [address]  Phone [phone] Fax [phone]

## 2017-01-02 NOTE — Patient Instructions (Signed)
MRI brain w/wo contrast Labs [facility]

## 2017-04-11 ENCOUNTER — Other Ambulatory Visit: Payer: Self-pay | Admitting: Cardiology

## 2017-05-11 ENCOUNTER — Other Ambulatory Visit: Payer: Self-pay

## 2017-05-11 ENCOUNTER — Ambulatory Visit (HOSPITAL_COMMUNITY): Payer: Self-pay | Attending: Cardiovascular Disease

## 2017-05-11 ENCOUNTER — Encounter (INDEPENDENT_AMBULATORY_CARE_PROVIDER_SITE_OTHER): Payer: Self-pay

## 2017-05-11 DIAGNOSIS — E669 Obesity, unspecified: Secondary | ICD-10-CM | POA: Insufficient documentation

## 2017-05-11 DIAGNOSIS — Z8673 Personal history of transient ischemic attack (TIA), and cerebral infarction without residual deficits: Secondary | ICD-10-CM | POA: Insufficient documentation

## 2017-05-11 DIAGNOSIS — Z6839 Body mass index (BMI) 39.0-39.9, adult: Secondary | ICD-10-CM | POA: Insufficient documentation

## 2017-05-11 DIAGNOSIS — I714 Abdominal aortic aneurysm, without rupture, unspecified: Secondary | ICD-10-CM

## 2017-05-11 DIAGNOSIS — I059 Rheumatic mitral valve disease, unspecified: Secondary | ICD-10-CM | POA: Insufficient documentation

## 2017-05-11 DIAGNOSIS — Z720 Tobacco use: Secondary | ICD-10-CM | POA: Insufficient documentation

## 2017-05-11 DIAGNOSIS — I10 Essential (primary) hypertension: Secondary | ICD-10-CM | POA: Insufficient documentation

## 2017-05-13 ENCOUNTER — Encounter: Payer: Self-pay | Admitting: Cardiology

## 2017-05-13 NOTE — Progress Notes (Addendum)
Cardiology Office Note:    Date:  05/14/2017   ID:  Gloria Cisneros, DOB March 20, 1965, MRN [id number]  PCP:  Kaleen Mask, MD  Cardiologist:  No primary care provider on file.    Referring MD: Kaleen Mask, *   Chief Complaint  Patient presents with  . Hypertension  . Mitral Regurgitation    History of Present Illness:    Gloria Cisneros is a 52 y.o. female with a hx of mitral regurgitation and HTN.  She also has a history of CP with normal stress test, PFO and CVA.  She is here today for followup and is doing well.  She denies PND, orthopnea, palpitations or syncope. She says that she thinks she is intolerant to some of the bases in the metoprolol formularies.  She says that when she got her lopressor refilled her tablets were changed from a new supplier and started having chest pain.  She says she felt like she was having a heart attack.  She described the pain as a very sharp pain that was constant while she took the medication and called [facility] and she got her pills changed and her sx stopped.  The pharmacist told her it may be related to a different lot of pills.  Later she got a new Rx and the pills looked different and started to have an achiness in her chest and she stopped those and got a new Rx and the sx resolved.  She says that the achiness would come on with exertion mowing her yard and lifting heaving things.  She also has noticed some DOE but again seems to be related to if she gets a different formulation of her lopressor.  Occasionally she will have some LE edema at the end of the day.    Past Medical History:  Diagnosis Date  . Dilated aortic root (HCC)    4cm by echo 05/2017  . Hypertension   . Mitral valve regurgitation   . PFO (patent foramen ovale)    by echo 07/2014  . TIA (transient ischemic attack) 07/2014    Past Surgical History:  Procedure Laterality Date  . ABDOMINAL HYSTERECTOMY    . ABDOMINAL SURGERY    . ECTOPIC PREGNANCY SURGERY    .  INSERTION OF MESH    . OOPHORECTOMY      Current Medications: Current Meds  Medication Sig  . clopidogrel (PLAVIX) 75 MG tablet Take 75 mg by mouth daily.  . metoprolol tartrate (LOPRESSOR) 25 MG tablet Take 1 tablet (25 mg total) by mouth 2 (two) times daily.  . nitroGLYCERIN (NITROSTAT) 0.4 MG SL tablet Place 0.4 mg under the tongue every 5 (five) minutes as needed for chest pain.   . [DISCONTINUED] metoprolol tartrate (LOPRESSOR) 25 MG tablet TAKE 1 TABLET BY MOUTH TWICE DAILY     Allergies:   Aspirin; Coconut oil; and Salvia officinalis   Social History   Socioeconomic History  . Marital status: Married    Spouse name: Not on file  . Number of children: 1  . Years of education: Not on file  . Highest education level: Some college, no degree  Occupational History  . Not on file  Social Needs  . Financial resource strain: Not on file  . Food insecurity:    Worry: Not on file    Inability: Not on file  . Transportation needs:    Medical: Not on file    Non-medical: Not on file  Tobacco Use  .  Smoking status: Current Every Day Smoker    Packs/day: 0.25    Types: Cigarettes  . Smokeless tobacco: Never Used  . Tobacco comment: smokes 3 cigarettes a day  Substance and Sexual Activity  . Alcohol use: Yes    Comment: 0.5 beer every 6-7 months (2 oz roughly)  . Drug use: No  . Sexual activity: Not on file  Lifestyle  . Physical activity:    Days per week: Not on file    Minutes per session: Not on file  . Stress: Not on file  Relationships  . Social connections:    Talks on phone: Not on file    Gets together: Not on file    Attends religious service: Not on file    Active member of club or organization: Not on file    Attends meetings of clubs or organizations: Not on file    Relationship status: Not on file  Other Topics Concern  . Not on file  Social History Narrative   Lives at home with husband, son moving back in soon   Right handed   3 cups of caffeine  daily      Family History: The patient's family history includes Cancer in her brother; Hyperlipidemia in her mother; Hypertension in her father.  ROS:   Please see the history of present illness.    ROS  All other systems reviewed and negative.   EKGs/Labs/Other Studies Reviewed:    The following studies were reviewed today: none  EKG:  EKG is  ordered today.  The ekg ordered today demonstrates NSR at 71bpm with no ST changes  Recent Labs: No results found for requested labs within last 8760 hours.   Recent Lipid Panel No results found for: CHOL, TRIG, HDL, CHOLHDL, VLDL, LDLCALC, LDLDIRECT  Physical Exam:    VS:  BP 120/80   Pulse 71   Ht 5' 3.5" (1.613 m)   Wt 232 lb 12.8 oz (105.6 kg)   BMI 40.59 kg/m     Wt Readings from Last 3 Encounters:  05/14/17 232 lb 12.8 oz (105.6 kg)  01/02/17 237 lb (107.5 kg)  03/29/16 222 lb 12.8 oz (101.1 kg)     GEN: 2 Well nourished, well developed in no acute distress HEENT: Normal NECK: No JVD; No carotid bruits LYMPHATICS: No lymphadenopathy CARDIAC: RRR, no murmurs, rubs, gallops RESPIRATORY:  Clear to auscultation without rales, wheezing or rhonchi  ABDOMEN: Soft, non-tender, non-distended MUSCULOSKELETAL:  No edema; No deformity  SKIN: Warm and dry NEUROLOGIC:  Alert and oriented x 3 PSYCHIATRIC:  Normal affect   ASSESSMENT:    1. Non-rheumatic mitral regurgitation   2. Benign essential HTN   3. PFO (patent foramen ovale)   4. Dilated aortic root (HCC)   5. Chest pain, unspecified type    PLAN:    In order of problems listed above:  1.  Mild MR -  2D echo 05/11/2017 showed no MR.  2.  HTN - BP well controlled on exam today.  She will continue on Lopressor 25mg  BID.    3.  PFO - echo 05/11/2017 showed no PFO by colorflow doppler and normal PAP and normal RV, RA and LA size.  She will continue on Plavix for h/o CVA.  4.  Mildly dilated ascending aorta - recent echo showed aortic diameter 40mm.  Will repeat echo  in 1 year.  Bp is well controlled.  I instructed her to avoid upper body weight lifting.  5.  Chest pain -  she has typical and atypical pain.  She thinks that it is related to the formulation of her Lopressor but I am concerned that her sx come on with exertion as well.  I am going to get a stress myoview to rule out ischemia.  She continues to smoke and has CRFs including obesity and HTN.  I will get stress myoview to rule out ischemia. I will get a copy of her lipids the day of her stress test.   Medication Adjustments/Labs and Tests Ordered: Current medicines are reviewed at length with the patient today.  Concerns regarding medicines are outlined above.  Orders Placed This Encounter  Procedures  . EKG 12-Lead   Meds ordered this encounter  Medications  . metoprolol tartrate (LOPRESSOR) 25 MG tablet    Sig: Take 1 tablet (25 mg total) by mouth 2 (two) times daily.    Dispense:  180 tablet    Refill:  3    Signed, Armanda Magic, MD  05/14/2017 10:41 AM    [facility]

## 2017-05-14 ENCOUNTER — Encounter: Payer: Self-pay | Admitting: Cardiology

## 2017-05-14 ENCOUNTER — Ambulatory Visit (INDEPENDENT_AMBULATORY_CARE_PROVIDER_SITE_OTHER): Payer: Self-pay | Admitting: Cardiology

## 2017-05-14 VITALS — BP 120/80 | HR 71 | Ht 63.5 in | Wt 232.8 lb

## 2017-05-14 DIAGNOSIS — I7121 Aneurysm of the ascending aorta, without rupture: Secondary | ICD-10-CM | POA: Insufficient documentation

## 2017-05-14 DIAGNOSIS — I10 Essential (primary) hypertension: Secondary | ICD-10-CM

## 2017-05-14 DIAGNOSIS — I712 Thoracic aortic aneurysm, without rupture: Secondary | ICD-10-CM | POA: Insufficient documentation

## 2017-05-14 DIAGNOSIS — I77810 Thoracic aortic ectasia: Secondary | ICD-10-CM

## 2017-05-14 DIAGNOSIS — Q2112 Patent foramen ovale: Secondary | ICD-10-CM

## 2017-05-14 DIAGNOSIS — I340 Nonrheumatic mitral (valve) insufficiency: Secondary | ICD-10-CM

## 2017-05-14 DIAGNOSIS — Q211 Atrial septal defect: Secondary | ICD-10-CM

## 2017-05-14 DIAGNOSIS — R079 Chest pain, unspecified: Secondary | ICD-10-CM

## 2017-05-14 MED ORDER — METOPROLOL TARTRATE 25 MG PO TABS: 25.0000 mg | ORAL_TABLET | Freq: Two times a day (BID) | ORAL | 3 refills | Status: DC

## 2017-05-14 NOTE — Patient Instructions (Addendum)
Medication Instructions:  Your physician recommends that you continue on your current medications as directed. Please refer to the Current Medication list given to you today.  If you need a refill on your cardiac medications, please contact your pharmacy first.  Labwork: Your physician recommends that you return for lab work in: 1-2 weeks for cholesterol. Please schedule same day as stress test    Testing/Procedures: Your physician has requested that you have an echocardiogram in April 2020. Echocardiography is a painless test that uses sound waves to create images of your heart. It provides your doctor with information about the size and shape of your heart and how well your heart's chambers and valves are working. This procedure takes approximately one hour. There are no restrictions for this procedure.  Your physician has requested that you have en exercise stress myoview. For further information please visit [email or web address] Please follow instruction sheet, as given.   Follow-Up: Your physician wants you to follow-up in: 1 year with Dr. Mayford Knife. You will receive a reminder letter in the mail two months in advance. If you don't receive a letter, please call our office to schedule the follow-up appointment.  Any Other Special Instructions Will Be Listed Below (If Applicable).   Thank you for choosing [facility]    Lyda Perone, RN  [phone]  If you need a refill on your cardiac medications before your next appointment, please call your pharmacy.

## 2017-05-17 ENCOUNTER — Telehealth (HOSPITAL_COMMUNITY): Payer: Self-pay | Admitting: *Deleted

## 2017-05-17 NOTE — Telephone Encounter (Signed)
Patient given detailed instructions per Myocardial Perfusion Study Information Sheet for the test on 05/22/17. Patient notified to arrive 15 minutes early and that it is imperative to arrive on time for appointment to keep from having the test rescheduled.  If you need to cancel or reschedule your appointment, please call the office within 24 hours of your appointment. . Patient verbalized understanding. Shavon Ashmore Jacqueline    

## 2017-05-22 ENCOUNTER — Ambulatory Visit (HOSPITAL_COMMUNITY): Payer: Self-pay | Attending: Cardiology

## 2017-05-22 ENCOUNTER — Other Ambulatory Visit: Payer: Self-pay | Admitting: *Deleted

## 2017-05-22 DIAGNOSIS — I77810 Thoracic aortic ectasia: Secondary | ICD-10-CM

## 2017-05-22 DIAGNOSIS — R079 Chest pain, unspecified: Secondary | ICD-10-CM | POA: Insufficient documentation

## 2017-05-22 LAB — LIPID PANEL
CHOL/HDL RATIO: 5.6 ratio — AB (ref 0.0–4.4)
Cholesterol, Total: 180 mg/dL (ref 100–199)
HDL: 32 mg/dL — ABNORMAL LOW (ref >39)
LDL CALC: 111 mg/dL — AB (ref 0–99)
TRIGLYCERIDES: 183 mg/dL — AB (ref 0–149)
VLDL Cholesterol Cal: 37 mg/dL (ref 5–40)

## 2017-05-22 MED ORDER — TECHNETIUM TC 99M TETROFOSMIN IV KIT
32.8000 | PACK | Freq: Once | INTRAVENOUS | Status: AC | PRN
  Administered 2017-05-22: 32.8 via INTRAVENOUS
  Filled 2017-05-22: qty 33

## 2017-05-23 ENCOUNTER — Ambulatory Visit (HOSPITAL_COMMUNITY): Payer: Self-pay | Attending: Cardiovascular Disease

## 2017-05-23 ENCOUNTER — Telehealth: Payer: Self-pay | Admitting: Cardiology

## 2017-05-23 ENCOUNTER — Telehealth: Payer: Self-pay

## 2017-05-23 DIAGNOSIS — E785 Hyperlipidemia, unspecified: Secondary | ICD-10-CM

## 2017-05-23 DIAGNOSIS — R079 Chest pain, unspecified: Secondary | ICD-10-CM

## 2017-05-23 LAB — MYOCARDIAL PERFUSION IMAGING
CHL CUP NUCLEAR SDS: 3
CHL CUP NUCLEAR SSS: 5
CHL RATE OF PERCEIVED EXERTION: 18
CSEPEDS: 30 s
CSEPEW: 9.1 METS
Exercise duration (min): 7 min
LV dias vol: 86 mL (ref 46–106)
LVSYSVOL: 28 mL
MPHR: 168 {beats}/min
NUC STRESS TID: 0.84
Peak HR: 157 {beats}/min
Percent HR: 93 %
RATE: 0.34
Rest HR: 75 {beats}/min
SRS: 2

## 2017-05-23 MED ORDER — TECHNETIUM TC 99M TETROFOSMIN IV KIT
32.1000 | PACK | Freq: Once | INTRAVENOUS | Status: AC | PRN
  Administered 2017-05-23: 32.1 via INTRAVENOUS
  Filled 2017-05-23: qty 33

## 2017-05-23 MED ORDER — ROSUVASTATIN CALCIUM 10 MG PO TABS: 10.0000 mg | ORAL_TABLET | Freq: Every day | ORAL | 11 refills | Status: DC

## 2017-05-23 NOTE — Telephone Encounter (Signed)
While reviewing lab results with patient, she states she woke up this morning around 5 AM with severe chest pain. Patient denied nausea, vomiting, or sweating. She states symptoms improved after she took morning dose of lopressor 25 mg. Patient has a prescription for SL nitro but currently tablets are expired and she does not have any additional refills. Patient denies all symptoms right now. I informed patient to call ems if symptoms return. Patient verbalized understanding and thankful for the call. Will forward to Dr. Mayford Knife for further recommendations.     Notes recorded by Phineas Semen, RN on 05/23/2017 at 3:47 PM EDT Patient made aware of lab results and Dr. Norris Cross recommendation to START Crestor 10 mg once a day. Patient in agreement with plan and thankful for the call. Patient scheduled for repeat labs on 07/18/17.   Notes recorded by Quintella Reichert, MD on 05/23/2017 at 2:00 PM EDT LDL and TAGs too high - start Crestor 10mg  daily and repeat FLP and ALT in 6 weeks

## 2017-05-23 NOTE — Telephone Encounter (Signed)
New message    Pt c/o medication issue:  1. Name of Medication: rosuvastatin (CRESTOR) 10 MG tablet  2. How are you currently taking this medication (dosage and times per day)? n/a  3. Are you having a reaction (difficulty breathing--STAT)? no  4. What is your medication issue? Patient states medication too costly $225.96

## 2017-05-24 MED ORDER — ATORVASTATIN CALCIUM 10 MG PO TABS: 10.0000 mg | ORAL_TABLET | Freq: Every day | ORAL | 11 refills | Status: DC

## 2017-05-24 MED ORDER — NITROGLYCERIN 0.4 MG SL SUBL: 0.4000 mg | SUBLINGUAL_TABLET | SUBLINGUAL | 0 refills | Status: DC | PRN

## 2017-05-24 NOTE — Telephone Encounter (Signed)
I spoke with patient and informed her of Dr. Norris Cross recommendation for coronary CTA to rule out CAD. Patient states she does not have insurance and would like to know if there is an alternative option. Per Dr. Mayford Knife order calcium scoring. Patient states she is okay with paying 150 for calcium scoring. I informed patient that schedulers will call to schedule appt. Patient verbalized understanding and thankful for the call.

## 2017-05-24 NOTE — Telephone Encounter (Signed)
I spoke with patient and informed her that Crestor will be switched to Lipitor 10 mg once a day and to keep same follow up appt. Patient verbalized understanding and thankful for the call.

## 2017-05-24 NOTE — Telephone Encounter (Signed)
Start Lipitor 10mg  daily instead of Crestor and repeat FLP and ALT in 6 weeks

## 2017-05-24 NOTE — Telephone Encounter (Signed)
Stress myoview showed no ischemia.  Since she is still having CP please get a coronary CTA to rule out CAD

## 2017-05-25 ENCOUNTER — Telehealth: Payer: Self-pay | Admitting: Cardiology

## 2017-05-25 NOTE — Telephone Encounter (Signed)
New message   Pt c/o medication issue:  1. Name of Medication: atorvastatin (LIPITOR) 10 MG tablet  2. How are you currently taking this medication (dosage and times per day)? As presribed  3. Are you having a reaction (difficulty breathing--STAT)? No  4. What is your medication issue? Patient does NOT want to take medication, states medication will give her joint pain

## 2017-05-25 NOTE — Telephone Encounter (Signed)
Patient stated she does not want to take Lipitor. She states she was given Lipitor in the hospital and caused severe elbow pain and patient unable to afford Crestor. Informed patient I would send to Dr. Mayford Knife for further recommendations. Patient verbalized understanding and thankful for the call.

## 2017-05-26 NOTE — Telephone Encounter (Signed)
Refer to lipid clinic 

## 2017-05-28 MED ORDER — PRAVASTATIN SODIUM 40 MG PO TABS: 40.0000 mg | ORAL_TABLET | Freq: Every evening | ORAL | 11 refills | Status: DC

## 2017-05-28 NOTE — Telephone Encounter (Signed)
I spoke with patient and instructed patient to START pravastatin 40 mg daily. Patient in agreement with plan, verbalized understanding and thankful for the call. Patient will keep scheduled f/u appt for labs.

## 2017-05-28 NOTE — Telephone Encounter (Signed)
Would try pravastatin 40mg  daily - should be on the $4 list for pt even without insurance and is generally well tolerated.

## 2017-06-04 ENCOUNTER — Ambulatory Visit (INDEPENDENT_AMBULATORY_CARE_PROVIDER_SITE_OTHER)
Admission: RE | Admit: 2017-06-04 | Discharge: 2017-06-04 | Disposition: A | Discharge status: Home / Self Care | Payer: Self-pay | Source: Ambulatory Visit | Attending: Cardiology | Admitting: Cardiology

## 2017-06-04 ENCOUNTER — Telehealth: Payer: Self-pay | Admitting: Cardiology

## 2017-06-04 DIAGNOSIS — R079 Chest pain, unspecified: Secondary | ICD-10-CM

## 2017-06-04 NOTE — Telephone Encounter (Signed)
Unfortunately options will be limited due to affordability since patient does not have insurance. Simvastatin is likely the only other statin aside from pravastatin that will be cheap without insurance. Would recommend trying simvastatin 20mg  every night.

## 2017-06-04 NOTE — Telephone Encounter (Signed)
I spoke with patient. She states she started taking pravastatin on Wednesday and on Saturday and Sunday she felt very fatigued and drowsy. She stated she did not take on Sunday night and today she is starting to feel better. I explained that it may take a few more days for the  body to adjust to the new meds. She states she felt so drowsy that she was scared she wouldn't wake up. Patient states she does not have insurance. Crestor was not affordable and pt stated she had a bad reaction to Lipitor. Patient had a CT cardiac scoring today.  I informed patient that I would forward to Dr. Mayford Knife for further recommendations. She verbalized understanding and thankful for the call.

## 2017-06-04 NOTE — Telephone Encounter (Signed)
New message    Pt c/o medication issue:  1. Name of Medication:  pravastatin (PRAVACHOL) 40 MG tablet Take 1 tablet (40 mg total) by mouth every evening.        2. How are you currently taking this medication (dosage and times per day)? 1 tablet daily   3. Are you having a reaction (difficulty breathing--STAT)?  Yes   4. What is your medication issue? Extreme fatigue not feeling well, would like to speak with someone before she leave for echo

## 2017-06-04 NOTE — Telephone Encounter (Signed)
Refer to lipid clinic 

## 2017-06-05 ENCOUNTER — Telehealth: Payer: Self-pay

## 2017-06-05 DIAGNOSIS — I712 Thoracic aortic aneurysm, without rupture, unspecified: Secondary | ICD-10-CM

## 2017-06-05 MED ORDER — SIMVASTATIN 20 MG PO TABS: 20.0000 mg | ORAL_TABLET | Freq: Every day | ORAL | 11 refills | Status: DC

## 2017-06-05 NOTE — Telephone Encounter (Signed)
Notes recorded by Phineas Semen, RN on 06/05/2017 at 8:56 AM EDT I made patient aware of noncardiac portion of CT scan. I informed patient of 4.1 cm thoracic aortic aneurysm which is consistent with echo and Dr. Mayford Knife recommends repeating echo in 1 year. She is in agreement with treatment plan and thankful for the call. I informed patient I will call back with calcium score and any additional recommendations.   Notes recorded by Quintella Reichert, MD on 06/04/2017 at 4:06 PM EDT Noncardiac portion of CT showed a 4.1cm thoracic aortic aneurysm. Which is consistent with echo findings. Repeat echo in 1 year for dilated aorta

## 2017-06-05 NOTE — Telephone Encounter (Signed)
I spoke with patient regarding options and affordability. I informed patient that per Aundra Millet, [facility] she could switch to simvastatin 20 mg every night. Patient is in agreement with treatment plan. Labs rescheduled for 08/02/17. She verbalized understanding and thankful for the call.

## 2017-06-12 ENCOUNTER — Telehealth: Payer: Self-pay | Admitting: Cardiology

## 2017-06-12 NOTE — Telephone Encounter (Signed)
New Message ° ° °Patient is calling to obtain the results of her CT. Please call to discuss.  °

## 2017-06-12 NOTE — Telephone Encounter (Signed)
Patient requesting calcium score result. Informed patient I would send to Dr. Mayford Knife for review.

## 2017-06-13 NOTE — Telephone Encounter (Signed)
Patient made aware of calcium score and informed of no evidence of CAD on coronary CTA from a year ago. Patient verbalized understanding and thankful for the call

## 2017-06-13 NOTE — Telephone Encounter (Signed)
Calcium score was minimal at 5 and no evidence of CAD on coronary CTA a year ago

## 2017-07-18 ENCOUNTER — Other Ambulatory Visit: Payer: Self-pay

## 2017-08-02 ENCOUNTER — Other Ambulatory Visit: Payer: Self-pay | Admitting: *Deleted

## 2017-08-02 DIAGNOSIS — E785 Hyperlipidemia, unspecified: Secondary | ICD-10-CM

## 2017-08-03 LAB — LIPID PANEL
CHOL/HDL RATIO: 4 ratio (ref 0.0–4.4)
Cholesterol, Total: 125 mg/dL (ref 100–199)
HDL: 31 mg/dL — AB (ref >39)
LDL Calculated: 67 mg/dL (ref 0–99)
TRIGLYCERIDES: 135 mg/dL (ref 0–149)
VLDL Cholesterol Cal: 27 mg/dL (ref 5–40)

## 2017-08-03 LAB — ALT: ALT: 16 IU/L (ref 0–32)

## 2018-04-03 ENCOUNTER — Other Ambulatory Visit: Payer: Self-pay | Admitting: Nurse Practitioner

## 2018-04-03 ENCOUNTER — Ambulatory Visit
Admission: RE | Admit: 2018-04-03 | Discharge: 2018-04-03 | Disposition: A | Discharge status: Home / Self Care | Payer: Self-pay | Source: Ambulatory Visit | Attending: Nurse Practitioner | Admitting: Nurse Practitioner

## 2018-04-03 DIAGNOSIS — T1490XA Injury, unspecified, initial encounter: Secondary | ICD-10-CM

## 2018-06-05 ENCOUNTER — Other Ambulatory Visit: Payer: Self-pay | Admitting: Cardiology

## 2018-06-24 ENCOUNTER — Telehealth: Payer: Self-pay

## 2018-06-24 NOTE — Telephone Encounter (Signed)
Virtual Visit Pre-Appointment Phone Call  "(Name), I am calling you today to discuss your upcoming appointment. We are currently trying to limit exposure to the virus that causes COVID-19 by seeing patients at home rather than in the office."  1. "What is the BEST phone number to call the day of the visit?" - include this in appointment notes  2. "Do you have or have access to (through a family member/friend) a smartphone with video capability that we can use for your visit?" a. If yes - list this number in appt notes as "cell" (if different from BEST phone #) and list the appointment type as a VIDEO visit in appointment notes b. If no - list the appointment type as a PHONE visit in appointment notes  3. Confirm consent - "In the setting of the current Covid19 crisis, you are scheduled for a (phone or video) visit with your provider on (date) at (time).  Just as we do with many in-office visits, in order for you to participate in this visit, we must obtain consent.  If you'd like, I can send this to your mychart (if signed up) or email for you to review.  Otherwise, I can obtain your verbal consent now.  All virtual visits are billed to your insurance company just like a normal visit would be.  By agreeing to a virtual visit, we'd like you to understand that the technology does not allow for your provider to perform an examination, and thus may limit your provider's ability to fully assess your condition. If your provider identifies any concerns that need to be evaluated in person, we will make arrangements to do so.  Finally, though the technology is pretty good, we cannot assure that it will always work on either your or our end, and in the setting of a video visit, we may have to convert it to a phone-only visit.  In either situation, we cannot ensure that we have a secure connection.  Are you willing to proceed?" STAFF: Did the patient verbally acknowledge consent to telehealth visit? Document  YES/NO here: YES  4. Advise patient to be prepared - "Two hours prior to your appointment, go ahead and check your blood pressure, pulse, oxygen saturation, and your weight (if you have the equipment to check those) and write them all down. When your visit starts, your provider will ask you for this information. If you have an Apple Watch or Kardia device, please plan to have heart rate information ready on the day of your appointment. Please have a pen and paper handy nearby the day of the visit as well."  5. Give patient instructions for MyChart download to smartphone OR [email or web address] as below if video visit (depending on what platform provider is using)  6. Inform patient they will receive a phone call 15 minutes prior to their appointment time (may be from unknown caller ID) so they should be prepared to answer    TELEPHONE CALL NOTE  Savahanna D Mccoppin has been deemed a candidate for a follow-up tele-health visit to limit community exposure during the Covid-19 pandemic. I spoke with the patient via phone to ensure availability of phone/video source, confirm preferred email & phone number, and discuss instructions and expectations.  I reminded Gloria Cisneros to be prepared with any vital sign and/or heart rhythm information that could potentially be obtained via home monitoring, at the time of her visit. I reminded Dub Mikes to expect a phone call prior to  her visit.  Aariona Momon, CMA 06/24/2018 2:20 PM   INSTRUCTIONS FOR DOWNLOADING THE MYCHART APP TO SMARTPHONE  - The patient must first make sure to have activated MyChart and know their login information - If Apple, go to [employer] and type in MyChart in the search bar and download the app. If Android, ask patient to go to [employer] and type in [place] in the search bar and download the app. The app is free but as with any other app downloads, their phone may require them to verify saved payment information or Apple/Android  password.  - The patient will need to then log into the app with their MyChart username and password, and select [facility] as their healthcare provider to link the account. When it is time for your visit, go to the MyChart app, find appointments, and click Begin Video Visit. Be sure to Select Allow for your device to access the Microphone and Camera for your visit. You will then be connected, and your provider will be with you shortly.  **If they have any issues connecting, or need assistance please contact MyChart service desk [phone])**  **If using a computer, in order to ensure the best quality for their visit they will need to use either of the following Internet Browsers: [employer] or Google Chrome**  IF USING DOXIMITY or DOXY.ME - The patient will receive a link just prior to their visit by text.     FULL LENGTH CONSENT FOR TELE-HEALTH VISIT   I hereby voluntarily request, consent and authorize [facility] and its employed or contracted physicians, physician assistants, nurse practitioners or other licensed health care professionals (the Practitioner), to provide me with telemedicine health care services (the "Services") as deemed necessary by the treating Practitioner. I acknowledge and consent to receive the Services by the Practitioner via telemedicine. I understand that the telemedicine visit will involve communicating with the Practitioner through live audiovisual communication technology and the disclosure of certain medical information by electronic transmission. I acknowledge that I have been given the opportunity to request an in-person assessment or other available alternative prior to the telemedicine visit and am voluntarily participating in the telemedicine visit.  I understand that I have the right to withhold or withdraw my consent to the use of telemedicine in the course of my care at any time, without affecting my right to future care or treatment,  and that the Practitioner or I may terminate the telemedicine visit at any time. I understand that I have the right to inspect all information obtained and/or recorded in the course of the telemedicine visit and may receive copies of available information for a reasonable fee.  I understand that some of the potential risks of receiving the Services via telemedicine include:  Marland Kitchen Delay or interruption in medical evaluation due to technological equipment failure or disruption; . Information transmitted may not be sufficient (e.g. poor resolution of images) to allow for appropriate medical decision making by the Practitioner; and/or  . In rare instances, security protocols could fail, causing a breach of personal health information.  Furthermore, I acknowledge that it is my responsibility to provide information about my medical history, conditions and care that is complete and accurate to the best of my ability. I acknowledge that Practitioner's advice, recommendations, and/or decision may be based on factors not within their control, such as incomplete or inaccurate data provided by me or distortions of diagnostic images or specimens that may result from electronic transmissions. I  understand that the practice of medicine is not an exact science and that Practitioner makes no warranties or guarantees regarding treatment outcomes. I acknowledge that I will receive a copy of this consent concurrently upon execution via email to the email address I last provided but may also request a printed copy by calling the office of [facility].    I understand that my insurance will be billed for this visit.   I have read or had this consent read to me. . I understand the contents of this consent, which adequately explains the benefits and risks of the Services being provided via telemedicine.  . I have been provided ample opportunity to ask questions regarding this consent and the Services and have had my questions  answered to my satisfaction. . I give my informed consent for the services to be provided through the use of telemedicine in my medical care  By participating in this telemedicine visit I agree to the above.

## 2018-06-27 ENCOUNTER — Encounter: Payer: Self-pay | Admitting: Cardiology

## 2018-06-27 DIAGNOSIS — E785 Hyperlipidemia, unspecified: Secondary | ICD-10-CM

## 2018-06-27 HISTORY — DX: Hyperlipidemia, unspecified: E78.5

## 2018-06-27 NOTE — Progress Notes (Signed)
Virtual Visit via Video Note   This visit type was conducted due to national recommendations for restrictions regarding the COVID-19 Pandemic (e.g. social distancing) in an effort to limit this patient's exposure and mitigate transmission in our community.  Due to her co-morbid illnesses, this patient is at least at moderate risk for complications without adequate follow up.  This format is felt to be most appropriate for this patient at this time.  All issues noted in this document were discussed and addressed.  A limited physical exam was performed with this format.  Please refer to the patient's chart for her consent to telehealth for [facility].   Evaluation Performed:  Follow-up visit  This visit type was conducted due to national recommendations for restrictions regarding the COVID-19 Pandemic (e.g. social distancing).  This format is felt to be most appropriate for this patient at this time.  All issues noted in this document were discussed and addressed.  No physical exam was performed (except for noted visual exam findings with Video Visits).  Please refer to the patient's chart (MyChart message for video visits and phone note for telephone visits) for the patient's consent to telehealth for [facility].  Date:  06/28/2018   ID:  Gloria Cisneros, DOB 09-24-1965, MRN [id number]  Patient Location:  Home  Provider location:   [place]  PCP:  Kaleen MaskElkins, Wilson Oliver, MD  Cardiologist:  Armanda Magicraci Turner, MD Electrophysiologist:  None   Chief Complaint:  Hypertension and MR  History of Present Illness:    Gloria Cisneros is a 53 y.o. female who presents via audio/video conferencing for a telehealth visit today.    Gloria Cisneros is a 52y.o. female with a hx of mitral regurgitation and HTN.  She also has a history of CP with normal stress test, PFO and CVA.  She has had problems with varying formulations of lopressor in the past causing CP and SOB which stopped after she switched  companies. She had a coronary CTA in 2018 that showed no CAD and a calcium score of 0.  A nuclear stress test which done a year ago which showed no ischemia.  2D echo a year ago showed normal LV function with EF 60 to 65% with grade 1 diastolic dysfunction and mildly dilated ascending aorta at 40 mm.  She is here today for followup and is doing well.  She denies any chest pain or pressure (except when she gets the wrong formulary Lopressor) SOB, DOE, PND, orthopnea, LE edema, dizziness, palpitations or syncope. She is compliant with  her meds and is tolerating meds with no SE.     The patient does not have symptoms concerning for COVID-19 infection (fever, chills, cough, or new shortness of breath).    Prior CV studies:   The following studies were reviewed today:  2D echo and stress test 2019, coronary CTA 201 8  Past Medical History:  Diagnosis Date  . Dilated aortic root (HCC)    4cm by echo 05/2017  . Hyperlipidemia LDL goal <70 06/27/2018  . Hypertension   . Mitral valve regurgitation   . PFO (patent foramen ovale)    by echo 07/2014  . TIA (transient ischemic attack) 07/2014   Past Surgical History:  Procedure Laterality Date  . ABDOMINAL HYSTERECTOMY    . ABDOMINAL SURGERY    . ECTOPIC PREGNANCY SURGERY    . INSERTION OF MESH    . OOPHORECTOMY       Current Meds  Medication Sig  .  acetaminophen (TYLENOL) 500 MG tablet Take 500 mg by mouth every 6 (six) hours as needed.  . clopidogrel (PLAVIX) 75 MG tablet Take 75 mg by mouth daily.  Marland Kitchen loratadine (CLARITIN REDITABS) 10 MG dissolvable tablet Take 10 mg by mouth as needed for allergies.  . metoprolol tartrate (LOPRESSOR) 25 MG tablet Take 1 tablet by mouth twice daily  . nitroGLYCERIN (NITROSTAT) 0.4 MG SL tablet Place 1 tablet (0.4 mg total) under the tongue every 5 (five) minutes as needed for chest pain.     Allergies:   Aspirin; Coconut oil; and Salvia officinalis   Social History   Tobacco Use  . Smoking status:  Current Every Day Smoker    Packs/day: 0.25    Types: Cigarettes  . Smokeless tobacco: Never Used  . Tobacco comment: smokes 3 cigarettes a day  Substance Use Topics  . Alcohol use: Yes    Comment: 0.5 beer every 6-7 months (2 oz roughly)  . Drug use: No     Family Hx: The patient's family history includes Cancer in her brother; Hyperlipidemia in her mother; Hypertension in her father.  ROS:   Please see the history of present illness.     All other systems reviewed and are negative.   Labs/Other Tests and Data Reviewed:    Recent Labs: 08/02/2017: ALT 16   Recent Lipid Panel Lab Results  Component Value Date/Time   CHOL 125 08/02/2017 12:16 PM   TRIG 135 08/02/2017 12:16 PM   HDL 31 (L) 08/02/2017 12:16 PM   CHOLHDL 4.0 08/02/2017 12:16 PM   LDLCALC 67 08/02/2017 12:16 PM    Wt Readings from Last 3 Encounters:  06/28/18 211 lb 9.6 oz (96 kg)  05/22/17 232 lb (105.2 kg)  05/14/17 232 lb 12.8 oz (105.6 kg)     Objective:    Vital Signs:  BP 126/83   Ht 5\' 3"  (1.6 m)   Wt 211 lb 9.6 oz (96 kg)   BMI 37.48 kg/m    CONSTITUTIONAL:  Well nourished, well developed female in no acute distress.  EYES: anicteric MOUTH: oral mucosa is pink RESPIRATORY: Normal respiratory effort, symmetric expansion CARDIOVASCULAR: No peripheral edema SKIN: No rash, lesions or ulcers MUSCULOSKELETAL: no digital cyanosis NEURO: Cranial Nerves II-XII grossly intact, moves all extremities PSYCH: Intact judgement and insight.  A&O x 3, Mood/affect appropriate   ASSESSMENT & PLAN:    1.  Mild mitral regurgitation - no MR was noted on echo 05/11/2008.  2.  Hypertension - her blood pressure has been controlled at home.  She will continue on Lopressor 25 mg twice daily.  3.  PFO - this is been noted on prior echoes but on last echo a year ago there was no evidence of PFO by color flow Doppler and right-sided pressures and chamber size were normal.  She does have a history of CVA and has  been on Plavix.  4.  Mildly dilated ascending aorta - echo a year ago showed an ascending aortic diameter of 40 mm.  I will repeat a limited echo this year to make sure this is not progressed.  I will do this in July once COVID-19 has settled down.  We did discuss the benefits of statin therapy and aortic aneurysms.  5.  Hyperlipidemia -her LDL goal is less than 70 given her dilated aorta.  Her last LDL was 67 a year ago.  She stopped taking simvastatin because it was making her sleep 16 hours a day.  This completely  resolved when she stopped the simvastatin.  I recommended trying Livalo 2 mg daily.  She will let me know in a week if her symptoms come back.  I will repeat an FLP and ALT in July.   6.  COVID-19 Education:The signs and symptoms of COVID-19 were discussed with the patient and how to seek care for testing (follow up with PCP or arrange E-visit).  The importance of social distancing was discussed today.  Patient Risk:   After full review of this patient's clinical status, I feel that they are at least moderate risk at this time.  Time:   Today, I have spent 10 minutes directly with the patient on Video discussing medical problems including HTN, dilated aorta, lipids, PFO.  We also reviewed the symptoms of COVID 19 and the ways to protect against contracting the virus with telehealth technology.  I spent an additional 5 minutes reviewing patient's chart including 2D echo and stress test from 2019.  Medication Adjustments/Labs and Tests Ordered: Current medicines are reviewed at length with the patient today.  Concerns regarding medicines are outlined above.  Tests Ordered: No orders of the defined types were placed in this encounter.  Medication Changes: No orders of the defined types were placed in this encounter.   Disposition:  Follow up in 1 year(s)  Signed, Armanda Magic, MD  06/28/2018 8:22 AM    [facility]

## 2018-06-28 ENCOUNTER — Encounter: Payer: Self-pay | Admitting: Cardiology

## 2018-06-28 ENCOUNTER — Telehealth (INDEPENDENT_AMBULATORY_CARE_PROVIDER_SITE_OTHER): Payer: Self-pay | Admitting: Cardiology

## 2018-06-28 ENCOUNTER — Other Ambulatory Visit: Payer: Self-pay

## 2018-06-28 VITALS — BP 126/83 | Ht 63.0 in | Wt 211.6 lb

## 2018-06-28 DIAGNOSIS — E785 Hyperlipidemia, unspecified: Secondary | ICD-10-CM

## 2018-06-28 DIAGNOSIS — I7121 Aneurysm of the ascending aorta, without rupture: Secondary | ICD-10-CM

## 2018-06-28 DIAGNOSIS — Q2112 Patent foramen ovale: Secondary | ICD-10-CM

## 2018-06-28 DIAGNOSIS — I10 Essential (primary) hypertension: Secondary | ICD-10-CM

## 2018-06-28 DIAGNOSIS — I340 Nonrheumatic mitral (valve) insufficiency: Secondary | ICD-10-CM

## 2018-06-28 DIAGNOSIS — Q211 Atrial septal defect: Secondary | ICD-10-CM

## 2018-06-28 DIAGNOSIS — Z7189 Other specified counseling: Secondary | ICD-10-CM

## 2018-06-28 DIAGNOSIS — I712 Thoracic aortic aneurysm, without rupture: Secondary | ICD-10-CM

## 2018-06-28 MED ORDER — PITAVASTATIN CALCIUM 2 MG PO TABS: 2.0000 mg | ORAL_TABLET | Freq: Every day | ORAL | 3 refills | Status: DC

## 2018-06-28 MED ORDER — CLOPIDOGREL BISULFATE 75 MG PO TABS: 75.0000 mg | ORAL_TABLET | Freq: Every day | ORAL | 3 refills | Status: AC

## 2018-06-28 MED ORDER — METOPROLOL TARTRATE 25 MG PO TABS: 25.0000 mg | ORAL_TABLET | Freq: Two times a day (BID) | ORAL | 3 refills | Status: DC

## 2018-06-28 NOTE — Patient Instructions (Addendum)
Medication Instructions:  Stop Simvastatin  Start: Livalo 2 mg, daily, by mouth  If you need a refill on your cardiac medications before your next appointment, please call your pharmacy.   Lab work: Fasting Labs: Lipid and Liver, same day as echo in July  If you have labs (blood work) drawn today and your tests are completely normal, you will receive your results only by: Marland Kitchen MyChart Message (if you have MyChart) OR . A paper copy in the mail If you have any lab test that is abnormal or we need to change your treatment, we will call you to review the results.  Testing/Procedures: Your physician has requested that you have an limited echocardiogram around July. Echocardiography is a painless test that uses sound waves to create images of your heart. It provides your doctor with information about the size and shape of your heart and how well your heart's chambers and valves are working. This procedure takes approximately one hour. There are no restrictions for this procedure.  Follow-Up: At [facility], you and your health needs are our priority.  As part of our continuing mission to provide you with exceptional heart care, we have created designated Provider Care Teams.  These Care Teams include your primary Cardiologist (physician) and Advanced Practice Providers (APPs -  Physician Assistants and Nurse Practitioners) who all work together to provide you with the care you need, when you need it. You will need a follow up appointment in 1 years.  Please call our office 2 months in advance to schedule this appointment.  You may see Dr. Mayford Knife or one of the following Advanced Practice Providers on your designated Care Team:   [place], PA-C Ronie Spies, PA-C . Jacolyn Reedy, PA-C

## 2018-07-31 ENCOUNTER — Other Ambulatory Visit (HOSPITAL_COMMUNITY): Payer: Self-pay | Admitting: Cardiology

## 2018-07-31 DIAGNOSIS — I7121 Aneurysm of the ascending aorta, without rupture: Secondary | ICD-10-CM

## 2018-07-31 DIAGNOSIS — I712 Thoracic aortic aneurysm, without rupture: Secondary | ICD-10-CM

## 2018-08-19 ENCOUNTER — Other Ambulatory Visit (HOSPITAL_COMMUNITY): Payer: Self-pay

## 2018-08-19 ENCOUNTER — Other Ambulatory Visit: Payer: Self-pay

## 2018-09-03 ENCOUNTER — Other Ambulatory Visit: Payer: Self-pay

## 2018-09-03 ENCOUNTER — Ambulatory Visit (HOSPITAL_COMMUNITY): Payer: Self-pay | Attending: Internal Medicine

## 2018-09-03 DIAGNOSIS — I712 Thoracic aortic aneurysm, without rupture: Secondary | ICD-10-CM | POA: Insufficient documentation

## 2018-09-03 DIAGNOSIS — E785 Hyperlipidemia, unspecified: Secondary | ICD-10-CM

## 2018-09-03 DIAGNOSIS — I7121 Aneurysm of the ascending aorta, without rupture: Secondary | ICD-10-CM

## 2018-09-03 LAB — HEPATIC FUNCTION PANEL
ALT: 16 IU/L (ref 0–32)
AST: 15 IU/L (ref 0–40)
Albumin: 4.3 g/dL (ref 3.8–4.9)
Alkaline Phosphatase: 71 IU/L (ref 39–117)
Bilirubin Total: 0.3 mg/dL (ref 0.0–1.2)
Bilirubin, Direct: 0.09 mg/dL (ref 0.00–0.40)
Total Protein: 6.4 g/dL (ref 6.0–8.5)

## 2018-09-03 LAB — LIPID PANEL
Chol/HDL Ratio: 5.6 ratio — ABNORMAL HIGH (ref 0.0–4.4)
Cholesterol, Total: 169 mg/dL (ref 100–199)
HDL: 30 mg/dL — ABNORMAL LOW (ref >39)
LDL Calculated: 104 mg/dL — ABNORMAL HIGH (ref 0–99)
Triglycerides: 173 mg/dL — ABNORMAL HIGH (ref 0–149)
VLDL Cholesterol Cal: 35 mg/dL (ref 5–40)

## 2018-09-04 ENCOUNTER — Telehealth: Payer: Self-pay | Admitting: Cardiology

## 2018-09-04 ENCOUNTER — Other Ambulatory Visit: Payer: Self-pay

## 2018-09-04 DIAGNOSIS — E785 Hyperlipidemia, unspecified: Secondary | ICD-10-CM

## 2018-09-04 NOTE — Telephone Encounter (Signed)
I spoke to the patient about her lab results.  She said that she had mentioned awhile back that she could not afford or take the Livalo 2 mg Daily.    She cannot take statins and says that she does not want "the shot."  She would prefer trying fish oil and then have labs checked.  She said that she asked about this in the past, but never got a response.  She wanted Dr Radford Pax to know that she is not consuming a lot of beef, but eats a lot of tuna and wants to know if this is safe.  Please advise, thank you

## 2018-09-04 NOTE — Telephone Encounter (Signed)
Please refer to lipid clinic since we have a new drug that is not a shot

## 2018-09-04 NOTE — Telephone Encounter (Signed)
Follow Up   Patient is returning the phone call. Patient states that she is going to mow her grass, so if the call back happens after 10:30am, just leave a detailed voicemail.

## 2018-09-04 NOTE — Telephone Encounter (Signed)
I spoke to the patient and she is willing to visit the [facility].

## 2018-09-04 NOTE — Telephone Encounter (Signed)
Patient is returning call regarding lab results

## 2018-09-06 NOTE — Telephone Encounter (Signed)
This encounter was created in error - please disregard.

## 2018-09-09 ENCOUNTER — Ambulatory Visit: Payer: Self-pay

## 2018-09-09 ENCOUNTER — Telehealth: Payer: Self-pay

## 2018-09-09 NOTE — Telephone Encounter (Signed)
LMTCB

## 2018-09-09 NOTE — Telephone Encounter (Signed)
-----   Message from Traci R Turner, MD sent at 09/03/2018 11:59 AM EDT ----- Echo showed normal LVF with moderate thickening of the heart muscle, midly dilated ascending aorta at 42mm which is stable.  Please get a cardiac MRI to assess the entire thoracic aorta and an abdominal US to assess for AAA.  Repeat limited echo in 1 year for aortic aneurysm.  Please get an FLP and ALT as her last one was a year ago. 

## 2018-09-11 ENCOUNTER — Other Ambulatory Visit: Payer: Self-pay

## 2018-09-11 DIAGNOSIS — I712 Thoracic aortic aneurysm, without rupture, unspecified: Secondary | ICD-10-CM

## 2018-09-11 NOTE — Telephone Encounter (Signed)
-----   Message from Sueanne Margarita, MD sent at 09/03/2018 11:59 AM EDT ----- Echo showed normal LVF with moderate thickening of the heart muscle, midly dilated ascending aorta at 51mm which is stable.  Please get a cardiac MRI to assess the entire thoracic aorta and an abdominal US to assess for AAA.  Repeat limited echo in 1 year for aortic aneurysm.  Please get an FLP and ALT as her last one was a year ago.

## 2018-09-11 NOTE — Telephone Encounter (Signed)
Notes recorded by Frederik Schmidt, RN on 09/11/2018 at 2:47 PM EDT  The patient has been notified of the result and verbalized understanding. All questions (if any) were answered.  Frederik Schmidt, RN 09/11/2018 2:47 PM

## 2018-09-18 ENCOUNTER — Encounter: Payer: Self-pay | Admitting: *Deleted

## 2018-09-18 ENCOUNTER — Ambulatory Visit (HOSPITAL_COMMUNITY)
Admission: RE | Admit: 2018-09-18 | Discharge: 2018-09-18 | Disposition: A | Discharge status: Home / Self Care | Payer: Self-pay | Source: Ambulatory Visit | Attending: Cardiology | Admitting: Cardiology

## 2018-09-18 ENCOUNTER — Telehealth: Payer: Self-pay

## 2018-09-18 ENCOUNTER — Other Ambulatory Visit: Payer: Self-pay

## 2018-09-18 DIAGNOSIS — I712 Thoracic aortic aneurysm, without rupture, unspecified: Secondary | ICD-10-CM

## 2018-09-18 NOTE — Telephone Encounter (Signed)
-----   Message from Sueanne Margarita, MD sent at 09/18/2018  9:15 AM EDT ----- No evidence of AAA

## 2018-09-18 NOTE — Telephone Encounter (Signed)
LMTCB

## 2018-09-18 NOTE — Telephone Encounter (Signed)
Pt advised her results and report mailed to her per her request... she prefers not to be on My Chart.

## 2018-09-26 ENCOUNTER — Encounter: Payer: Self-pay | Admitting: *Deleted

## 2018-10-01 ENCOUNTER — Ambulatory Visit: Payer: Self-pay

## 2018-10-04 ENCOUNTER — Telehealth (HOSPITAL_COMMUNITY): Payer: Self-pay | Admitting: Emergency Medicine

## 2018-10-04 NOTE — Telephone Encounter (Signed)
Left message on voicemail with name and callback number Breniya Goertzen RN Navigator Cardiac Imaging [facility] [phone] Office [phone] Cell  

## 2018-10-07 ENCOUNTER — Other Ambulatory Visit: Payer: Self-pay | Admitting: Cardiology

## 2018-10-07 ENCOUNTER — Ambulatory Visit (HOSPITAL_COMMUNITY)
Admission: RE | Admit: 2018-10-07 | Discharge: 2018-10-07 | Disposition: A | Discharge status: Home / Self Care | Payer: Self-pay | Source: Ambulatory Visit | Attending: Cardiology | Admitting: Cardiology

## 2018-10-07 ENCOUNTER — Other Ambulatory Visit: Payer: Self-pay

## 2018-10-07 ENCOUNTER — Telehealth: Payer: Self-pay | Admitting: Cardiology

## 2018-10-07 DIAGNOSIS — I712 Thoracic aortic aneurysm, without rupture, unspecified: Secondary | ICD-10-CM

## 2018-10-07 LAB — CREATININE, SERUM
Creatinine, Ser: 0.91 mg/dL (ref 0.44–1.00)
GFR calc Af Amer: >60 mL/min (ref >60)
GFR calc non Af Amer: >60 mL/min (ref >60)

## 2018-10-07 MED ORDER — GADOBUTROL 1 MMOL/ML IV SOLN
10.0000 mL | Freq: Once | INTRAVENOUS | Status: AC | PRN
  Administered 2018-10-07: 10 mL via INTRAVENOUS

## 2018-10-07 NOTE — Telephone Encounter (Signed)
Discussed with Dr. Radford Pax and she would like to add an MRA.  Spoke with Willette Cluster and made her aware.  Message sent to pre cert.

## 2018-10-07 NOTE — Telephone Encounter (Signed)
Lunette from [facility] would like clarification of orders for the patient's MRI today at 11 am.  She would llike to know if the patient needs an MRA of the chest as well as what has been ordered. Please call as the test is scheduled for 11am

## 2018-10-08 ENCOUNTER — Telehealth: Payer: Self-pay

## 2018-10-08 ENCOUNTER — Encounter: Payer: Self-pay | Admitting: Cardiology

## 2018-10-08 NOTE — Telephone Encounter (Signed)
Notes recorded by Frederik Schmidt, RN on 10/08/2018 at 8:11 AM EDT  The patient has been notified of the result and verbalized understanding. All questions (if any) were answered.  Frederik Schmidt, RN 10/08/2018 8:11 AM   ------

## 2018-10-08 NOTE — Telephone Encounter (Signed)
-----   Message from Sueanne Margarita, MD sent at 10/07/2018 10:10 PM EDT ----- Please let patient know that labs were normal.  Continue current medical therapy.

## 2018-10-09 ENCOUNTER — Telehealth: Payer: Self-pay | Admitting: Cardiology

## 2018-10-09 ENCOUNTER — Other Ambulatory Visit: Payer: Self-pay

## 2018-10-09 DIAGNOSIS — E785 Hyperlipidemia, unspecified: Secondary | ICD-10-CM

## 2018-10-09 NOTE — Telephone Encounter (Signed)
Discussed the results of MRI and echo to patient.  She has a small aortic aneurysm measuring 4.1cm that is stable compared to 2018.  She says that she stopped the Livalo due to severe fatigue and has not tolerated other statins as well.  I am going to refer her to lipid clinic to discuss options for treatment of hyperlipidemia.  Her LDL goal is < 70 due to aortic aneurysm.

## 2018-10-09 NOTE — Telephone Encounter (Signed)
Notes recorded by Emily Filbert, RN on 10/09/2018 at 8:57 AM EDT  Attempted to call the patient.  No answer- I left a message to please call back.  Phone note started.

## 2018-10-09 NOTE — Telephone Encounter (Signed)
1) I spoke with the patient and advised her of her MRI results.  The patient proceeded to tell me that she was very confused and wanted to speak with Dr. Radford Pax. She states she had been told that she had an aneurysm, then she didn't and now we are telling her it's back.   I reviewed the patient's echo with her again and read Dr. Theodosia Blender comments word for word.  I reviewed her AAA [place] as well.  I have confirmed with the patient that she does have a dilated aortic root that is stable at 4.1 cm. She is aware that she does not have a AAA. I have reviewed the anatomy of the aorta to try to clarify this for her.   She states she was called last month and told there may be a "shadow" there, but it could be enlarged.  She was unsure who called her.  I was a little confused by the conversation. I looked back at her imaging studies/ echos back to 2018. She was noted to have a mild thoracic aneurysm at 4.1 cm.   I have advised the patient again that by what I have reviewed, her aortic is stable.   She would again like this clarified by Dr. Radford Pax.    2) I also advised Dr. Radford Pax wanted to confirm she had a lipid clinic appt. - Per the patient, she does not feel that she needs medication to decrease her cholesterol. - She is adamant about this. - She is only willing to try OTC fish oil that is odorless/ tasteless, but wanted an ok from Dr. Radford Pax that this will not interfere with her other medications.   I advised I will review with Dr. Radford Pax and let her know the patient's feelings on her lipids.    I will confirm with Dr. Radford Pax: - if there is a specific type of OTC fish oil she recommends - when the patient should have her lipids checked again if only taking fish oil. - I have also discussed injectable lipid lowering drugs with her since she has failed multiple statins and she is not agreeable to this.   The patient was agreeable with the above

## 2018-10-09 NOTE — Telephone Encounter (Signed)
Notes recorded by Sueanne Margarita, MD on 10/08/2018 at 10:29 PM EDT  Cardiac MRI showed normal LVF with stable ascending aortic root dilatation at 4.1cm. This corresponds to recent echo findings. Please repeat limited echo in 1 year for followup of aortic root dilatation. Please make sure she has an appt with lipid clinic

## 2018-10-21 NOTE — Progress Notes (Signed)
Patient ID: Gloria Cisneros                 DOB: 06/16/1965                      MRN: [id number]     HPI: Gloria Cisneros is a 53 y.o. female referred by Dr. Radford Pax to lipid clinic. PMH is significant for mitral regurgitation and HTN. She also has a history of aortic aneurysm (09/2018), CP with normal stress test, PFO and CVA.   Recent Cardiac MRI (09/2018) showed normal LVF with stable ascending aortic root dilatation at 4.1cm. Echo (09/2018) showed normal LVF with moderate thickening of the heart muscle, midly dilated ascending aorta at 28mm which is stable. Coronary calcium scoring performed 06/2017 with result of 5. (43 percentile for age and sex matched control).  It is important to note, pt does not have medication insurance coverage.  Pt called today for initial appt with lipid clinic. Pt verifies multiple intolerances to statins in the past and voices concern regarding PCSK9 inhibitors due to how they are injectable medications. She states she is "allergic to injections and pain". Pt is hesitant about statin use considering experiencing severe fatigue. Patient is currently smoking 3-15 cigarettes/day depending on her stress level. She is using a "quit key" which she finds helpful to wean her off cigarettes.   Current HLD meds: none Previously tried: atorvastatin 10 mg daily (elbow pain), pravastatin 40 mg daily (fatigue), rosuvastatin 10 mg daily (cost), simvastatin 20 mg daily (fatigue), pitavastatin 2 mg daily (fatigue) LDL goal: <70 mg/dL (aortic aneurysm, prior CVA)   Family History: Brother (cancer); mother (HLD); father (HTN)  Social History: current smoker (3-15 cigs/day), infrequent alcohol consumption   Labs: 09/03/18: TC 169 TG 173 HDL 30 VLDL 35 LDL 104; pitavstatin 2 mg daily 08/05/18: TC 125 TG 135 HDL 31 VLDL 27 LDL 67; atorvastatin (05/24/17-05/28/17), pravastatin 40 mg daily (05/28/17-06/05/17), simvastatin (06/05/17-06/28/18)   Wt Readings from Last 3 Encounters:  06/28/18 211  lb 9.6 oz (96 kg)  05/22/17 232 lb (105.2 kg)  05/14/17 232 lb 12.8 oz (105.6 kg)   BP Readings from Last 3 Encounters:  06/28/18 126/83  05/14/17 120/80  01/02/17 (!) 145/94   Pulse Readings from Last 3 Encounters:  05/14/17 71  01/02/17 81  05/23/16 63    Renal function: CrCl cannot be calculated (Unknown ideal weight.).  Past Medical History:  Diagnosis Date  . Dilated aortic root (HCC)    4.1cm by MRI 09/2018 and ascending aorta 4.2cm.  Marland Kitchen Hyperlipidemia LDL goal <70 06/27/2018  . Hypertension   . Mitral valve regurgitation   . PFO (patent foramen ovale)    by echo 07/2014  . TIA (transient ischemic attack) 07/2014    Current Outpatient Medications on File Prior to Visit  Medication Sig Dispense Refill  . acetaminophen (TYLENOL) 500 MG tablet Take 500 mg by mouth every 6 (six) hours as needed.    . clopidogrel (PLAVIX) 75 MG tablet Take 1 tablet (75 mg total) by mouth daily. 90 tablet 3  . loratadine (CLARITIN REDITABS) 10 MG dissolvable tablet Take 10 mg by mouth as needed for allergies.    . metoprolol tartrate (LOPRESSOR) 25 MG tablet Take 1 tablet (25 mg total) by mouth 2 (two) times daily. 180 tablet 3  . nitroGLYCERIN (NITROSTAT) 0.4 MG SL tablet Place 1 tablet (0.4 mg total) under the tongue every 5 (five) minutes as needed for chest pain. 15 tablet  0   No current facility-administered medications on file prior to visit.     Allergies  Allergen Reactions  . Aspirin Swelling    Especially Tongue.   . Coconut Oil Anaphylaxis  . Salvia Officinalis Anaphylaxis     Assessment/Plan:  1. Hyperlipidemia - LDL goal < 70 mg/dL; therefore, pt is not at goal. Educated pt about her therapy options, which include PCSK9 inhibitor (Repatha and Praluent affiliated with patient assistance programs) or statin therapy (atorvastatin and simvastatin on $4 [employer] list). Also, discussed fibrate therapy as an option, however, less ideal option, considering it will not decrease LDL  as much (fenofibrate and gemfibrozil on $4 [employer] list). Educated pt on efficacy, adverse effects, administration, and cost. Pt is willing to re-initiate statin therapy at a lower dose than previously tried to decrease risk of insomnia. Initiate simvastatin at 5 mg PO weekly at night and titrate up slowly. Plan to f/u with pt in 1 week on 10/29/18 via telephone.   2. Smoking cessation - Smokes 3-15 cigarettes/day; therefore, pt is not at goal. Pt offered information regarding smoking quit line and politely declined. She is interested in hearing about pharmacotherapy options for smoking cessation. Educated pt on efficacy, adverse effects, and administration of  NRT, bupropion, and varenicline.  Pt is interested in starting bupropion 150 mg once daily for 3 days in the morning then take one tablet twice daily. Plan to f/u with pt in 1 week on 10/29/18 via telephone.   Thank you for involving pharmacy to assist in providing Gloria Cisneros's care.   Zachery Conch, PharmD PGY2 Ambulatory Care Pharmacy Resident

## 2018-10-22 ENCOUNTER — Other Ambulatory Visit: Payer: Self-pay

## 2018-10-22 ENCOUNTER — Encounter: Payer: Self-pay | Admitting: Pharmacist

## 2018-10-22 ENCOUNTER — Telehealth (INDEPENDENT_AMBULATORY_CARE_PROVIDER_SITE_OTHER): Payer: Self-pay | Admitting: Pharmacist

## 2018-10-22 DIAGNOSIS — Z720 Tobacco use: Secondary | ICD-10-CM

## 2018-10-22 DIAGNOSIS — E785 Hyperlipidemia, unspecified: Secondary | ICD-10-CM

## 2018-10-22 MED ORDER — BUPROPION HCL ER (SR) 150 MG PO TB12: ORAL_TABLET | ORAL | 3 refills | Status: DC

## 2018-10-22 MED ORDER — SIMVASTATIN 5 MG PO TABS: 5.0000 mg | ORAL_TABLET | ORAL | 11 refills | Status: DC

## 2018-10-22 NOTE — Telephone Encounter (Signed)
This encounter was created in error - please disregard.

## 2018-10-28 ENCOUNTER — Telehealth: Payer: Self-pay | Admitting: Pharmacist

## 2018-10-28 NOTE — Telephone Encounter (Signed)
New Message     Pt c/o medication issue:  1. Name of Medication: Bupropion 150mg  and Simvastatin 5mg   2. How are you currently taking this medication (dosage and times per day)? 1 tablet daily for 3 days then increase 1 tablet by mouth twice daily and 1 tablet by mouth once a week  3. Are you having a reaction (difficulty breathing--STAT)? No  4. What is your medication issue? Patient states she just got the message medication was ready so she will pick it up this afternoon and start taking it.

## 2018-10-28 NOTE — Progress Notes (Signed)
Patient ID: Gloria MikesMelba D Villela                 DOB: [birth year]                    MRN: [id number]     HPI: Gloria Cisneros is a 53 y.o. female referred by Dr. Mayford Knifeurner to lipid clinic. PMH is significant for mitral regurgitation and HTN. She also has a history of aortic aneurysm (09/2018), CP with normal stress test, PFO and CVA.  Recent Cardiac MRI (09/2018) showed normal LVF with stable ascending aortic root dilatation at 4.1cm. ECHO (09/2018) showed normal LVF with moderate thickening of the heart muscle, midly dilated ascending aorta at 42mm which is stable. Coronary calcium scoring performed 06/2017 with result of 5. (84 percentile for age and sex matched control).  It is important to note, pt does not have medication insurance coverage.  Pt contacted via telephone for f/u appt with lipid clinic. Pt stated she was able to pick up prescription yesterday so she has not yet started taking simvastatin or bupropion yet.  Current Medications: simvastatin 5 mg weekly  Intolerances: atorvastatin 10 mg daily (elbow pain), pravastatin 40 mg daily (fatigue), rosuvastatin 10 mg daily (cost), simvastatin 20 mg daily (fatigue), pitavastatin 2 mg daily (fatigue)  LDL goal: <70 mg/dL (aortic aneurysm, prior CVA)   Family History: Brother (cancer); mother (HLD); father (HTN)  Social History: current smoker (3-15 cigs/day), infrequent alcohol consumption  Risk factors: prior CVA, aortic aneurysm, current smoker, obese, calcium score   Labs: 09/03/18: TC 169 TG 173 HDL 30 VLDL 35 LDL 104; pitavstatin 2 mg daily 08/05/18: TC 125 TG 135 HDL 31 VLDL 27 LDL 67; atorvastatin (05/24/17-05/28/17), pravastatin 40 mg daily (05/28/17-06/05/17), simvastatin (06/05/17-06/28/18)  Past Medical History:  Diagnosis Date  . Dilated aortic root (HCC)    4.1cm by MRI 09/2018 and ascending aorta 4.2cm.  Marland Kitchen. Hyperlipidemia LDL goal <70 06/27/2018  . Hypertension   . Mitral valve regurgitation   . PFO (patent foramen ovale)    by  echo 07/2014  . TIA (transient ischemic attack) 07/2014    Current Outpatient Medications on File Prior to Visit  Medication Sig Dispense Refill  . acetaminophen (TYLENOL) 500 MG tablet Take 500 mg by mouth every 6 (six) hours as needed.    Marland Kitchen. buPROPion (WELLBUTRIN SR) 150 MG 12 hr tablet Take 1 tablet by mouth once daily for 3 days then increase to 1 tablet by mouth twice daily. 60 tablet 3  . clopidogrel (PLAVIX) 75 MG tablet Take 1 tablet (75 mg total) by mouth daily. 90 tablet 3  . loratadine (CLARITIN REDITABS) 10 MG dissolvable tablet Take 10 mg by mouth as needed for allergies.    . metoprolol tartrate (LOPRESSOR) 25 MG tablet Take 1 tablet (25 mg total) by mouth 2 (two) times daily. 180 tablet 3  . nitroGLYCERIN (NITROSTAT) 0.4 MG SL tablet Place 1 tablet (0.4 mg total) under the tongue every 5 (five) minutes as needed for chest pain. 15 tablet 0  . simvastatin (ZOCOR) 5 MG tablet Take 1 tablet (5 mg total) by mouth once a week. (take medication in the evening). 4 tablet 11   No current facility-administered medications on file prior to visit.     Allergies  Allergen Reactions  . Aspirin Swelling    Especially Tongue.   . Coconut Oil Anaphylaxis  . Salvia Officinalis Anaphylaxis    Assessment/Plan:  1. Hyperlipidemia - goal < 70 mg/dL; therefore  pt is not at goal. Determine if pt is tolerating at f/u appt. If tolerating then plan to increase simvastatin 5 mg once weekly to simvastatin 5 mg twice weekly. F/u with pt via telephone on 11/05/18.    2. Smoking cessation - Patient has not started taking bupropion yet. F/u with pt on 11/05/18.  Thank you for involving pharmacy to assist in providing Gloria Cisneros care.   Drexel Iha, PharmD PGY2 Ambulatory Care Pharmacy Resident

## 2018-10-29 ENCOUNTER — Telehealth (INDEPENDENT_AMBULATORY_CARE_PROVIDER_SITE_OTHER): Payer: Self-pay | Admitting: Pharmacist

## 2018-10-29 ENCOUNTER — Other Ambulatory Visit: Payer: Self-pay

## 2018-10-29 DIAGNOSIS — Z720 Tobacco use: Secondary | ICD-10-CM

## 2018-10-29 DIAGNOSIS — E785 Hyperlipidemia, unspecified: Secondary | ICD-10-CM

## 2018-11-04 NOTE — Progress Notes (Signed)
Patient ID: LATANZA PFEFFERKORN                 DOB: Mar 16, 1965                    MRN: [id number]     HPI: Gloria D Millsis a 53 y.o.femalereferred by Dr. [facility].PMH is significant for mitral regurgitation and HTN. She also has a history ofaortic aneurysm (09/2018),CP with normal stress test, PFO and CVA.  RecentCardiac MRI(09/2018)showed normal LVF with stable ascending aortic root dilatation at 4.1cm. ECHO(09/2018)showed normal LVF with moderate thickening of the heart muscle, midly dilated ascending aorta at 60mm which is stable. Coronary calcium scoring performed 06/2017 with resultof 5.(84 percentile for age and sex matched control).  It is important to note, pt does not have medication insurance coverage.  Pt contacted via telephone for f/u appt with lipid clinic. She has been tolerating simvastatin 5 mg once weekly.   Patient reports she has decreased from 3-15 cigarettes/day to 7-8 cigarettes/day. She has been tolerating bupropion well, specifically, she states having more energy and feeling better since last PharmD appt. Pt says how she has stopped biting her nails recently as well. She states she has been having less of the desire in general to smoke a cigarette.  She reports changing where she sits in the morning, trying to watch less TV in the morning, and is not watching any scary movies. Also, states she didn't smoke in the car yesterday when she drove to get dinner with her family. She still is using quit key intermittently, but reports "forgetting sometimes".   Current Medications: simvastatin 5 mg weekly  Intolerances:atorvastatin 10 mg daily(elbow pain), pravastatin 40 mg daily(fatigue), rosuvastatin 10 mg daily(cost), simvastatin 20 mg daily(fatigue), pitavastatin 2 mg daily(fatigue)  LDLgoal:<70 mg/dL (aortic aneurysm, prior CVA)  Family History:Brother(cancer); mother(HLD); father(HTN)  Social History:current smoker(7-8  cigs/day), infrequent alcohol consumption --Cravings are worse at night --Uses quit key intermittently --Triggers: waking up in the morning smoking while drinking her coffee, at night when she "just sits down from the day", watching scary movies, and driving.  Risk factors: prior CVA, aortic aneurysm, current smoker, obese, calcium score  Labs: 09/03/18: TC 169 TG 173 HDL 30 VLDL 35 LDL 104; pitavstatin 2 mg daily 08/05/18: TC 125 TG 135 HDL 31 VLDL 27 LDL 67;atorvastatin (05/24/17-05/28/17), pravastatin 40 mg daily (05/28/17-06/05/17), simvastatin (06/05/17-06/28/18)  Past Medical History:  Diagnosis Date  . Dilated aortic root (HCC)    4.1cm by MRI 09/2018 and ascending aorta 4.2cm.  Marland Kitchen Hyperlipidemia LDL goal <70 06/27/2018  . Hypertension   . Mitral valve regurgitation   . PFO (patent foramen ovale)    by echo 07/2014  . TIA (transient ischemic attack) 07/2014    Current Outpatient Medications on File Prior to Visit  Medication Sig Dispense Refill  . acetaminophen (TYLENOL) 500 MG tablet Take 500 mg by mouth every 6 (six) hours as needed.    Marland Kitchen buPROPion (WELLBUTRIN SR) 150 MG 12 hr tablet Take 1 tablet by mouth once daily for 3 days then increase to 1 tablet by mouth twice daily. 60 tablet 3  . clopidogrel (PLAVIX) 75 MG tablet Take 1 tablet (75 mg total) by mouth daily. 90 tablet 3  . loratadine (CLARITIN REDITABS) 10 MG dissolvable tablet Take 10 mg by mouth as needed for allergies.    . metoprolol tartrate (LOPRESSOR) 25 MG tablet Take 1 tablet (25 mg total) by mouth 2 (two) times daily. 180 tablet 3  .  nitroGLYCERIN (NITROSTAT) 0.4 MG SL tablet Place 1 tablet (0.4 mg total) under the tongue every 5 (five) minutes as needed for chest pain. 15 tablet 0  . simvastatin (ZOCOR) 5 MG tablet Take 1 tablet (5 mg total) by mouth once a week. (take medication in the evening). 4 tablet 11   No current facility-administered medications on file prior to visit.     Allergies  Allergen Reactions   . Aspirin Swelling    Especially Tongue.   . Coconut Oil Anaphylaxis  . Salvia Officinalis Anaphylaxis    Assessment/Plan:  1. Hyperlipidemia - goal < 70 mg/dL; therefore pt is not at goal. Patient is tolerating 5 mg weekly dose of simvastatin. Patient states she feels comfortable increasing dose to simvastatin 5 mg every other day dosing. Plan to increase to simvastatin 5 mg every other day dose.   2. Smoking Cessation - Smoking 7-8 cigarettes per day; pt's goal is no cigarettes in 2 weeks. Pt is tolerating bupropion 150 mg SR twice daily. Patient states she will try to eat a mint or change what activity she is doing at night if she gets a craving to smoke. Encouraged pt for her success and plan to f/u in 2 weeks.   Thank you for involving pharmacy to assist in providing Gloria Cisneros care.   Drexel Iha, PharmD PGY2 Ambulatory Care Pharmacy Resident

## 2018-11-05 ENCOUNTER — Telehealth (INDEPENDENT_AMBULATORY_CARE_PROVIDER_SITE_OTHER): Payer: Self-pay | Admitting: Pharmacist

## 2018-11-05 ENCOUNTER — Other Ambulatory Visit: Payer: Self-pay

## 2018-11-05 DIAGNOSIS — E785 Hyperlipidemia, unspecified: Secondary | ICD-10-CM

## 2018-11-05 MED ORDER — SIMVASTATIN 5 MG PO TABS: 5.0000 mg | ORAL_TABLET | Freq: Every day | ORAL | 11 refills | Status: DC

## 2018-11-06 ENCOUNTER — Telehealth: Payer: Self-pay

## 2018-11-19 ENCOUNTER — Telehealth: Payer: Self-pay | Admitting: Pharmacist

## 2018-11-19 NOTE — Telephone Encounter (Signed)
Called pt on 11/19/18 at 10:11 AM and left HIPAA-compliant VM with instructions to call back pharmacy phone number at [facility] clinic.  Wanted to assess ...  1) HLD - Pt recently increased simvastatin dose from 5 mg once weekly to 5 mg every other day. If tolerating, would like to increase to simvastatin 5 mg daily.  2) Smoking cessation - Patient had decreased smoking to 7-8 cigarettes on 11/05/18. She has been tolerating bupropion and has found success in changing where she sits in the morning, trying to watch less TV in the morning, using quit key intermittently and is not watching any scary movies. She is currently trying to stop smoking in the car and using a mint / change the activity she is doing at night if she gets a craving to smoke. Her goal was to smoke 0 cigarettes by 11/19/18. Plan to implement additional non-pharm strategies to assist with smoking if necessary.

## 2018-11-20 ENCOUNTER — Telehealth: Payer: Self-pay | Admitting: Pharmacist

## 2018-11-20 NOTE — Telephone Encounter (Signed)
Called pt at 12:12 PM on 11/20/18.  Pt is tolerating simvastatin 5 mg every other day dosing and stated she would be willing to increase to simvastatin 5 mg daily. Plan to increase simvastatin 5 mg daily and informed pt she has refills on her current prescription.   Pt has decreased smoking from 7-8 cigarettes per day to 5 cigarettes per day. Encouraged pt for her success. She states she has been having "weird dreams" occasionally. Discussed with pt this is likely due to bupropion. Pt finds these dreams tolerable and states she still feels that the bupropion is helping her quit smoking. She states her cravings are the worst at night. For example, she stated on Friday she smoked 1 cigarette after lunch and 4 cigarettes after dinner. Discussed with pt cutting out cigarettes before dinner by going on a walk, drinking a glass of water, using a mint/toothpick. Pt is agreeable to this plan.   Pt would like to be followed up in 1 month. Plan to call pt on 12/17/18 anytime after 9AM.

## 2018-12-17 ENCOUNTER — Telehealth: Payer: Self-pay | Admitting: Pharmacist

## 2018-12-17 NOTE — Telephone Encounter (Signed)
Called pt on 12/17/2018 at 11:10 AM and left HIPAA-compliant VM with instructions to call [place] clinic back   Plan to re-assess statin tolerance and smoking cessation   Drexel Iha, PharmD PGY2 Ambulatory Care Pharmacy Resident

## 2018-12-20 ENCOUNTER — Telehealth: Payer: Self-pay | Admitting: Pharmacist

## 2018-12-20 NOTE — Telephone Encounter (Signed)
Plan to discuss smoking cessation and possible increase in statin dosage

## 2018-12-20 NOTE — Telephone Encounter (Signed)
Called pt on 12/20/2018 at 2:48PM and left HIPAA-compliant VM with instructions to call [facility] clinic back   Drexel Iha, PharmD PGY2 Ambulatory Care Pharmacy Resident

## 2018-12-20 NOTE — Telephone Encounter (Signed)
Pt was able to [facility] back.  Pt states she attempted to increase simvastatin 5 mg once weekly to simvastatin 5 mg daily. However, after 4 days patient states she was unable to tolerate. She states she has decreased to simvastatin 5 mg every other day. Scheduled lipid/LFT lab draw for 03/24/19 and plan to follow up 03/25/19 with results. Informed pt these are fasting labs. Pt verbalized understanding.  Patient states she is down to smoking 4 cigarettes per day. She has found using cinnamon toothpicks and sucking on "dum dum" lollipops helpful. She remains tolerating bupropion 300 mg daily. She is pleased that she noticed she has been losing weight. She does not smoke in the car/house or before/after meals. Congratulated patient on her success!!! Follow up with patient on 01/17/2019 to re-assess success with smoking cessation.

## 2019-01-17 NOTE — Telephone Encounter (Signed)
Called pt on 01/17/2019 at 11:31AM and left HIPAA-compliant VM with instructions to call [facility] back   Plan to discuss smoking cessation.   Per last telephone note (12/20/2018), patient states she is down to smoking 4 cigarettes per day. She has found using cinnamon toothpicks and sucking on "dum dum" lollipops helpful. She remains tolerating bupropion 300 mg daily. She is pleased that she noticed she has been losing weight. She does not smoke in the car/house or before/after meals  Drexel Iha, PharmD PGY2 Ambulatory Care Pharmacy Resident

## 2019-02-14 ENCOUNTER — Telehealth: Payer: Self-pay | Admitting: Pharmacist

## 2019-02-14 NOTE — Telephone Encounter (Signed)
Called patient on 02/14/2019 at 3:05 PM   Patient states she is taking simvastatin 5 mg daily and it is making her tired. Discussed with patient she can decrease simvastatin 5 mg every other day (patient has tolerated this dose previously). Patient has follow up lipid panel scheduled for 03/24/2019. Will discuss alternative lipid therapy at that time if LDL remains above goal.  Patient is currently smoking 3-5 cigarettes daily (about the same since I last spoke with her 12/2018). She remains tolerating bupropion.  Patient states she has the strongest urges to smoke before bed and while driving. Encouraged patient for her efforts and continuing her smoking cessation journey. Discussed with patient while these habits will be the hardest to break that they will be breakable eventually. Asked patient which time are her urges the strongest to smoke - patient states her urges are worst before bed. Discussed with patient to target smoking in the car at least ONCE weekly - use alternative agents to cigarette such as to use cigarette dipped in water/crushed red hot candy, use red hot candy, chew big red gum as well as trying to think about what motivates her to quit smoking. If her cravings get harder than she anticipates then focus thoughts on how she will be able to smoke the following the day. Patient verbalized understanding. Patient states she appreciates the call as she thinks it finds her accountable.  Of note - patient has switched pharmacy from [facility] to [facility] on [address]. Patient will also be taking a trip to [place] this upcoming week (02/17/2019 - 02/21/2019) for 2-3 weeks.  Will follow up on 03/25/2019 to discuss lipid panel results as well as re-assess smoking cessation.  Thank you for involving pharmacy to assist in providing Ms.Mill's care.   Zachery Conch, PharmD PGY2 Ambulatory Care Pharmacy Resident

## 2019-03-17 ENCOUNTER — Telehealth: Payer: Self-pay | Admitting: Pharmacist

## 2019-03-17 DIAGNOSIS — E785 Hyperlipidemia, unspecified: Secondary | ICD-10-CM

## 2019-03-17 NOTE — Telephone Encounter (Signed)
Patient called wishing to speak with Zachery Conch. Corrie Dandy is at another site today. Offered to help patient, but she wished for message to be left with Corrie Dandy to call her back at [phone].

## 2019-03-17 NOTE — Telephone Encounter (Signed)
Called patient on 03/17/2019 at 3:45 PM and left HIPAA-compliant VM. Informed patient I will call her again tomorrow when I am at [facility] clinic.  Plan to discuss statin use and smoking cessation.  Thank you for involving pharmacy to assist in providing this patient's care.   Zachery Conch, PharmD PGY2 Ambulatory Care Pharmacy Resident

## 2019-03-19 NOTE — Addendum Note (Signed)
Addended by: Malena Peer D on: 03/19/2019 01:24 PM   Modules accepted: Orders

## 2019-03-21 ENCOUNTER — Telehealth: Payer: Self-pay | Admitting: Pharmacist

## 2019-03-21 NOTE — Telephone Encounter (Signed)
Called patient on 03/21/2019 at 2:34 PM   Plan to discuss statin use and smoking cessation.  Thank you for involving pharmacy to assist in providing this patient's care.   Zachery Conch, PharmD PGY2 Ambulatory Care Pharmacy Resident

## 2019-03-21 NOTE — Telephone Encounter (Signed)
Called patient on 03/21/2019 at 3:35 PM   Patient has had 9 loved ones pass away within the past month and has relapsed smoking. She stopped taking her medications during this time as well. She smokes 1-2 packs per day now. She has been struggling emotionally throughout the grieiving process. Discussed with patient that it might be optimal to consider pursuing psychotherapy to process grief. Patient politely declined and says she has been relying heavily on her son. She states she has restarted taking her medications 3 days ago -- encouraged patient for restarting her medications. Plan to check in with patient in 1 month to see how she is doing and re-assess smoking cessation status and how she is tolerating statin therapy.  Thank you for involving pharmacy to assist in providing this patient's care.   Zachery Conch, PharmD PGY2 Ambulatory Care Pharmacy Resident

## 2019-03-24 ENCOUNTER — Other Ambulatory Visit: Payer: Self-pay

## 2019-05-16 ENCOUNTER — Telehealth: Payer: Self-pay | Admitting: Pharmacist

## 2019-05-16 NOTE — Telephone Encounter (Signed)
Called patient on 05/16/2019 at 11:34 AM   Patient states she has been seeing a chiropractor multiple times per week (3/x per week) for her neck injury. She started going to chiropractor in 04/2019.   She still has not restarted bupropion (currently smoking) and simvastatin. She smokes 10 cigarettes per daily (decrease from > 1 PPD after life stressors (multiple family members passed away - 3 more passed away after last phone call (total = 12 family members/friends who died). She is motivated to re-start bupropion and simvastatin in 3 weeks once she completes chiropractor visits. She also is motivated to start exercising and improving dietary habits once she completes chiropractor visits.   Will follow up with patient in 07/2019.  Thank you for involving pharmacy to assist in providing this patient's care.   Zachery Conch, PharmD PGY2 Ambulatory Care Pharmacy Resident

## 2019-07-12 ENCOUNTER — Other Ambulatory Visit: Payer: Self-pay | Admitting: Cardiology

## 2019-07-22 ENCOUNTER — Telehealth: Payer: Self-pay | Admitting: Pharmacist

## 2019-07-22 DIAGNOSIS — Z720 Tobacco use: Secondary | ICD-10-CM

## 2019-07-22 MED ORDER — BUPROPION HCL ER (SR) 150 MG PO TB12: ORAL_TABLET | ORAL | 3 refills | Status: DC

## 2019-07-22 NOTE — Telephone Encounter (Signed)
Called patient on 07/22/2019 at 4:48 PM   Patient recently experienced loss of her step dad last week (total = 13 deaths in 2021 of family/friends).   She is not taking her meds due to multiple life stressors.  Advised patient I will provide refills. Discussed with her the importance of mental health - suggested referral to psych - she politely declined. Advised her to continue her meds as soon as she can.   Thank you for involving pharmacy to assist in providing this patient's care.   Zachery Conch, PharmD PGY2 Ambulatory Care Pharmacy Resident

## 2019-08-17 ENCOUNTER — Other Ambulatory Visit: Payer: Self-pay | Admitting: Cardiology

## 2019-09-02 ENCOUNTER — Other Ambulatory Visit: Payer: Self-pay | Admitting: Cardiology

## 2019-09-02 NOTE — Telephone Encounter (Signed)
Called patient to see if I could schedule her for overdue follow up appointment but got no answer so had to send in short supply of metoprolol with note that needs appointment.

## 2019-09-22 ENCOUNTER — Other Ambulatory Visit: Payer: Self-pay | Admitting: *Deleted

## 2019-09-22 ENCOUNTER — Other Ambulatory Visit: Payer: Self-pay

## 2019-09-22 DIAGNOSIS — E785 Hyperlipidemia, unspecified: Secondary | ICD-10-CM

## 2019-09-22 LAB — LIPID PANEL
Chol/HDL Ratio: 4.9 ratio — ABNORMAL HIGH (ref 0.0–4.4)
Cholesterol, Total: 153 mg/dL (ref 100–199)
HDL: 31 mg/dL — ABNORMAL LOW (ref >39)
LDL Chol Calc (NIH): 94 mg/dL (ref 0–99)
Triglycerides: 157 mg/dL — ABNORMAL HIGH (ref 0–149)
VLDL Cholesterol Cal: 28 mg/dL (ref 5–40)

## 2019-09-22 LAB — HEPATIC FUNCTION PANEL
ALT: 12 IU/L (ref 0–32)
AST: 11 IU/L (ref 0–40)
Albumin: 3.9 g/dL (ref 3.8–4.9)
Alkaline Phosphatase: 69 IU/L (ref 48–121)
Bilirubin Total: <0.2 mg/dL (ref 0.0–1.2)
Bilirubin, Direct: 0.05 mg/dL (ref 0.00–0.40)
Total Protein: 6.6 g/dL (ref 6.0–8.5)

## 2019-09-23 ENCOUNTER — Other Ambulatory Visit: Payer: Self-pay | Admitting: Cardiology

## 2019-09-26 ENCOUNTER — Telehealth: Payer: Self-pay | Admitting: Cardiology

## 2019-09-26 NOTE — Telephone Encounter (Signed)
Patient called back. Informed her that our office recommends patients to get vaccinated. Informed patient that majority of our employees received the [employer] vaccine. Informed patient of the most common side effects of each vaccine, that was give to our office on the latest meeting. Informed patient that the benefits out weigh the risk for sure. Encouraged patient to get vaccinated when she can. Patient stated she was going to go get vaccinated today, and if she ends up in the hospital that she will be upset. Encouraged patient to hope for the best, and to stay positive.

## 2019-09-26 NOTE — Telephone Encounter (Signed)
Left message for patient to call back  

## 2019-09-26 NOTE — Telephone Encounter (Signed)
New message:   Patient calling to see which shot she need to take for covid. Please call patient.

## 2019-09-26 NOTE — Telephone Encounter (Signed)
Transferred call to Pam I.

## 2019-10-11 ENCOUNTER — Other Ambulatory Visit: Payer: Self-pay | Admitting: Cardiology

## 2019-10-14 ENCOUNTER — Encounter: Payer: Self-pay | Admitting: Cardiology

## 2019-10-14 ENCOUNTER — Other Ambulatory Visit: Payer: Self-pay

## 2019-10-14 ENCOUNTER — Telehealth (INDEPENDENT_AMBULATORY_CARE_PROVIDER_SITE_OTHER): Payer: Self-pay | Admitting: Cardiology

## 2019-10-14 VITALS — Ht 63.0 in | Wt 222.0 lb

## 2019-10-14 DIAGNOSIS — R0789 Other chest pain: Secondary | ICD-10-CM | POA: Insufficient documentation

## 2019-10-14 DIAGNOSIS — I7121 Aneurysm of the ascending aorta, without rupture: Secondary | ICD-10-CM

## 2019-10-14 DIAGNOSIS — I340 Nonrheumatic mitral (valve) insufficiency: Secondary | ICD-10-CM

## 2019-10-14 DIAGNOSIS — Q211 Atrial septal defect: Secondary | ICD-10-CM

## 2019-10-14 DIAGNOSIS — E7800 Pure hypercholesterolemia, unspecified: Secondary | ICD-10-CM

## 2019-10-14 DIAGNOSIS — I10 Essential (primary) hypertension: Secondary | ICD-10-CM

## 2019-10-14 DIAGNOSIS — Q2112 Patent foramen ovale: Secondary | ICD-10-CM

## 2019-10-14 DIAGNOSIS — I712 Thoracic aortic aneurysm, without rupture: Secondary | ICD-10-CM

## 2019-10-14 MED ORDER — NITROGLYCERIN 0.4 MG SL SUBL: 0.4000 mg | SUBLINGUAL_TABLET | SUBLINGUAL | 3 refills | Status: AC | PRN

## 2019-10-14 MED ORDER — SIMVASTATIN 5 MG PO TABS: 5.0000 mg | ORAL_TABLET | ORAL | 3 refills | Status: DC

## 2019-10-14 NOTE — Progress Notes (Signed)
Virtual Visit via Video Note   This visit type was conducted due to national recommendations for restrictions regarding the COVID-19 Pandemic (e.g. social distancing) in an effort to limit this patient's exposure and mitigate transmission in our community.  Due to her co-morbid illnesses, this patient is at least at moderate risk for complications without adequate follow up.  This format is felt to be most appropriate for this patient at this time.  All issues noted in this document were discussed and addressed.  A limited physical exam was performed with this format.  Please refer to the patient's chart for her consent to telehealth for [facility].   Evaluation Performed:  Follow-up visit  This visit type was conducted due to national recommendations for restrictions regarding the COVID-19 Pandemic (e.g. social distancing).  This format is felt to be most appropriate for this patient at this time.  All issues noted in this document were discussed and addressed.  No physical exam was performed (except for noted visual exam findings with Video Visits).  Please refer to the patient's chart (MyChart message for video visits and phone note for telephone visits) for the patient's consent to telehealth for [facility].  Date:  10/14/2019   ID:  MALAYNA NOORI, DOB 1965-05-09, MRN [id number]  Patient Location:  Home  Provider location:   [place]  PCP:  Kaleen Mask, MD  Cardiologist:  Armanda Magic, MD Electrophysiologist:  None   Chief Complaint:  Hypertension and MR  History of Present Illness:    Neomia D Shackett is a 54 y.o. female who presents via audio/video conferencing for a telehealth visit today.    Moe D Tennyson is a 54y.o. female with a hx of mitral regurgitation and HTN.  She also has a history of CP with normal stress test, PFO and CVA.  She has had problems with varying formulations of lopressor in the past causing CP and SOB which stopped after she switched  companies. She had a coronary CTA in 2018 that showed no CAD and a calcium score of 0.  A nuclear stress test which done a year ago which showed no ischemia.  2D echo a year ago showed normal LV function with EF 60 to 65% with grade 1 diastolic dysfunction and mildly dilated ascending aorta at 40 mm.  She is here today for followup and is doing well.  She continues to have problems with sporadic chest pain similar to what she had when she had the coronary CTA.  She thinks it related to stress in her family life.  She denies SOB, DOE, PND, orthopnea,  palpitations or syncope. She occasionally has some dizziness that makes her feel off balance.  She occasionally has some mild LE edema if she stands on her feet a long time.  She is compliant with  her meds and is tolerating meds with no SE.     The patient does not have symptoms concerning for COVID-19 infection (fever, chills, cough, or new shortness of breath).   Prior CV studies:   The following studies were reviewed today:  Cardiac MRI 09/2018 FINDINGS: The atria were of normal size. Normal RV size and function Redundant and mobile atrial septum with no obvious PFO noted. Normal LV size and function Quantitative EF 76% (EDV 135 cc ESV 33 cc SV 102 cc)  No significant LVH with septal thickness 11 mm No delayed enhancement or infarct on post gadolinium images. Mild MR No significant AR/AS. Tri leaflet AV.  Aortic  MRA: Mild ascending aortic root dilatation stable 4.1 cm in ascending root. Normal arch vessels. Arch measures 2.4 cm and descending thoracic aorta measures 2.3 cm  IMPRESSION: 1.  Stable ascending aortic root dilatation 4.1 cm  2.  Normal LV size and function EF 76% with no LVH septum 11 mm  3.  No delayed hyper-enhancement on post gadolinium images  4.  Redundant and mobile atrial septum no obvious PFO  2D echo 08/2018 IMPRESSIONS   1. The left ventricle has normal systolic function, with an ejection  fraction of  60-65%. The cavity size was normal. There is moderate  asymmetric left ventricular hypertrophy. Left ventricular diastolic  Doppler parameters are consistent with impaired  relaxation.  2. The right ventricle has normal systolc function. The cavity was  normal. There is no increase in right ventricular wall thickness. Right  ventricular systolic pressure could not be assessed.  3. No stenosis of the aortic valve.  4. There is mild dilatation of the ascending aorta measuring 42 mm. I  have independently reviewed the images from 05/29/2016 echo and 05/11/2017  echo. Independent remeasurement of 05/29/2016 ascending aorta is 41 mm.  Unable to remeasure 2019 study which is  40 mm by image review. Likely no significant change has occured over  serial exams.   Past Medical History:  Diagnosis Date  . Chest pain, non-cardiac    coronary CTA with no CAD and Ca score of 0 in 2018  . Dilated aortic root (HCC)    4.1cm by MRI 09/2018 and ascending aorta 4.2cm.  Marland Kitchen Hyperlipidemia LDL goal <70 06/27/2018  . Hypertension   . Mitral valve regurgitation   . PFO (patent foramen ovale)    by echo 07/2014 but not present on Cardiac MRI 2020  . TIA (transient ischemic attack) 07/2014   Past Surgical History:  Procedure Laterality Date  . ABDOMINAL HYSTERECTOMY    . ABDOMINAL SURGERY    . ECTOPIC PREGNANCY SURGERY    . INSERTION OF MESH    . OOPHORECTOMY       Current Meds  Medication Sig  . acetaminophen (TYLENOL) 500 MG tablet Take 500 mg by mouth every 6 (six) hours as needed.  Marland Kitchen buPROPion (WELLBUTRIN SR) 150 MG 12 hr tablet Take 1 tablet by mouth once daily for 3 days then increase to 1 tablet by mouth twice daily. (Patient taking differently: Take 150 mg by mouth daily. )  . clopidogrel (PLAVIX) 75 MG tablet Take 75 mg by mouth daily.  Marland Kitchen loratadine (CLARITIN REDITABS) 10 MG dissolvable tablet Take 10 mg by mouth as needed for allergies.  . metoprolol tartrate (LOPRESSOR) 25 MG tablet Take 1  tablet (25 mg total) by mouth 2 (two) times daily. Please keep upcoming appointment with Dr. Mayford Knife to continue receiving refills.  . nitroGLYCERIN (NITROSTAT) 0.4 MG SL tablet Place 1 tablet (0.4 mg total) under the tongue every 5 (five) minutes as needed for chest pain.  . simvastatin (ZOCOR) 5 MG tablet Take 1 tablet (5 mg total) by mouth daily. (Patient taking differently: Take 5 mg by mouth. 5 mg every three days.)  . [DISCONTINUED] nitroGLYCERIN (NITROSTAT) 0.4 MG SL tablet Place 1 tablet (0.4 mg total) under the tongue every 5 (five) minutes as needed for chest pain.     Allergies:   Aspirin, Coconut oil, and Salvia officinalis   Social History   Tobacco Use  . Smoking status: Current Every Day Smoker    Packs/day: 0.25    Types: Cigarettes  .  Smokeless tobacco: Never Used  . Tobacco comment: smokes 3 cigarettes a day  Vaping Use  . Vaping Use: Former  Substance Use Topics  . Alcohol use: Yes    Comment: 0.5 beer every 6-7 months (2 oz roughly)  . Drug use: No     Family Hx: The patient's family history includes Cancer in her brother; Hyperlipidemia in her mother; Hypertension in her father.  ROS:   Please see the history of present illness.     All other systems reviewed and are negative.   Labs/Other Tests and Data Reviewed:    Recent Labs: 09/22/2019: ALT 12   Recent Lipid Panel Lab Results  Component Value Date/Time   CHOL 153 09/22/2019 09:08 AM   TRIG 157 (H) 09/22/2019 09:08 AM   HDL 31 (L) 09/22/2019 09:08 AM   CHOLHDL 4.9 (H) 09/22/2019 09:08 AM   LDLCALC 94 09/22/2019 09:08 AM    Wt Readings from Last 3 Encounters:  10/14/19 222 lb (100.7 kg)  06/28/18 211 lb 9.6 oz (96 kg)  05/22/17 232 lb (105.2 kg)     Objective:    Vital Signs:  Ht 5\' 3"  (1.6 m)   Wt 222 lb (100.7 kg)   BMI 39.33 kg/m     ASSESSMENT & PLAN:    1.  Mild mitral regurgitation  - no MR was noted on echo 08/2018  2.  Hypertension  -continue Lopressor 25mg  BID  3.   PFO  - this is been noted on prior echoes but on last echo 2019 there was no evidence of PFO by color flow Doppler and right-sided pressures and chamber size were normal.   -no PFO by cardiac MRI 2020 -she does have a history of CVA and has been on Plavix.  4.  Mildly dilated ascending aorta  -2D echo 08/2018 showed an ascending aorta of 9mm in diameter and cardiac MRI showed a diameter of 4.1cm. -repeat 2D echo  5.  Hyperlipidemia  -LDL goal is < 70 -her LDL was 94 last month -She is only taking simvastatin 5mg  every 3 days because it wipes her out and she says that if her LDL gets below 70 it makes her feel bad -she is going to try to increase to qod and repeat FLP in 8 weeks -we had recommended that she start Crestor but she wanted to wait until this OV to discuss  6.  Noncardiac chest pain -coronary CTA showed no CAD and Ca score of 0 -CP related to CP  Patient Risk:   After full review of this patient's clinical status, I feel that they are at least moderate risk at this time.  Time:   Today, I have spent 20 minutes on telemedicine discussing medical problems including HTN, dilated aorta, lipids, PFO and reviewing patient's chart including 2D echo and stress test from 2019.  Medication Adjustments/Labs and Tests Ordered: Current medicines are reviewed at length with the patient today.  Concerns regarding medicines are outlined above.  Tests Ordered: No orders of the defined types were placed in this encounter.  Medication Changes: Meds ordered this encounter  Medications  . nitroGLYCERIN (NITROSTAT) 0.4 MG SL tablet    Sig: Place 1 tablet (0.4 mg total) under the tongue every 5 (five) minutes as needed for chest pain.    Dispense:  15 tablet    Refill:  3    Disposition:  Follow up in 1 year(s)  Signed, 58m, MD  10/14/2019 9:10 AM    [facility]

## 2019-10-14 NOTE — Addendum Note (Signed)
Addended by: Theresia Majors on: 10/14/2019 09:23 AM   Modules accepted: Orders

## 2019-10-14 NOTE — Patient Instructions (Signed)
Medication Instructions:  Your physician has recommended you make the following change in your medication:  1) INCREASE simvastatin to 5 mg every other day  *If you need a refill on your cardiac medications before your next appointment, please call your pharmacy*   Lab Work: Fasting lipids and ALT in 8 weeks If you have labs (blood work) drawn today and your tests are completely normal, you will receive your results only by: Marland Kitchen MyChart Message (if you have MyChart) OR . A paper copy in the mail If you have any lab test that is abnormal or we need to change your treatment, we will call you to review the results.  Follow-Up: At [facility], you and your health needs are our priority.  As part of our continuing mission to provide you with exceptional heart care, we have created designated Provider Care Teams.  These Care Teams include your primary Cardiologist (physician) and Advanced Practice Providers (APPs -  Physician Assistants and Nurse Practitioners) who all work together to provide you with the care you need, when you need it.  We recommend signing up for the patient portal called "MyChart".  Sign up information is provided on this After Visit Summary.  MyChart is used to connect with patients for Virtual Visits (Telemedicine).  Patients are able to view lab/test results, encounter notes, upcoming appointments, etc.  Non-urgent messages can be sent to your provider as well.   To learn more about what you can do with MyChart, go to [email or web address].    Your next appointment:   1 year(s)  The format for your next appointment:   In Person  Provider:   You may see Armanda Magic, MD or one of the following Advanced Practice Providers on your designated Care Team:    Ronie Spies, PA-C  Jacolyn Reedy, PA-C

## 2019-12-09 ENCOUNTER — Other Ambulatory Visit: Payer: Self-pay

## 2020-07-15 ENCOUNTER — Encounter (INDEPENDENT_AMBULATORY_CARE_PROVIDER_SITE_OTHER): Payer: Self-pay

## 2020-07-15 ENCOUNTER — Other Ambulatory Visit: Payer: Self-pay

## 2020-07-15 ENCOUNTER — Other Ambulatory Visit: Payer: Self-pay | Admitting: *Deleted

## 2020-07-15 DIAGNOSIS — E7800 Pure hypercholesterolemia, unspecified: Secondary | ICD-10-CM

## 2020-07-15 LAB — LIPID PANEL
Chol/HDL Ratio: 5 ratio — ABNORMAL HIGH (ref 0.0–4.4)
Cholesterol, Total: 146 mg/dL (ref 100–199)
HDL: 29 mg/dL — ABNORMAL LOW (ref >39)
LDL Chol Calc (NIH): 84 mg/dL (ref 0–99)
Triglycerides: 191 mg/dL — ABNORMAL HIGH (ref 0–149)
VLDL Cholesterol Cal: 33 mg/dL (ref 5–40)

## 2020-07-15 LAB — ALT: ALT: 12 IU/L (ref 0–32)

## 2020-07-16 ENCOUNTER — Telehealth: Payer: Self-pay

## 2020-07-16 DIAGNOSIS — E7800 Pure hypercholesterolemia, unspecified: Secondary | ICD-10-CM

## 2020-07-16 NOTE — Telephone Encounter (Signed)
-----   Message from Quintella Reichert, MD sent at 07/16/2020  7:34 AM EDT ----- LDL good but TAGS are too high - needs to cut back on carbs, sugar and fats.  Eat more fruits and veggies - repeat in 3 months

## 2020-07-16 NOTE — Telephone Encounter (Signed)
The patient has been notified of the result and verbalized understanding.  All questions (if any) were answered. Theresia Majors, RN 07/16/2020 9:10 AM

## 2020-07-20 ENCOUNTER — Other Ambulatory Visit: Payer: Self-pay

## 2020-07-20 MED ORDER — CLOPIDOGREL BISULFATE 75 MG PO TABS: 75.0000 mg | ORAL_TABLET | Freq: Every day | ORAL | 0 refills | Status: DC

## 2020-08-11 ENCOUNTER — Telehealth: Payer: Self-pay | Admitting: Cardiology

## 2020-08-11 NOTE — Telephone Encounter (Signed)
Spoke with the patient who reports that she didn't take her Plavix for about 5 days from around 6/10-6/15. Patient states that once she started back she did have one episode that felt like some squeezing in her chest which resolved on its own.  Patient states that this past Saturday she woke up and the left side of her face was swollen and droopy. She was drooling from the left side of her mouth as well. She said it resolved about an hour later. She said she also had some fatigue and some blurry vision. She states by that afternoon she felt fine other than still fatigued. She states that on Sunday morning she noticed numbness in her face. She states that numbness lasted until the afternoon.  She states that she feels fine today with no chest pain, numbness, weakness, dizziness, or shortness of breath.  Patient has not notified PCP as the provider she was seeing has retired. I advised that she call their office to get set up with someone else at their office. Patient has seen neurology in the past but is unable to afford to see one at this time. Advised patient that she needs to continue taking Plavix and not miss any doses. Advised to make sure she contact us if she is having trouble getting it filled next time.  Patient advised on ER precautions and that I will make Dr. Mayford Knife aware.

## 2020-08-11 NOTE — Telephone Encounter (Signed)
The morning of 07/02 Gloria Cisneros woke up with the left side of her face swollen to the point of drooling out of the left side of her mouth, but the swelling came down that day. 07/03 otw to church her left side of her face went numb she continued to stay through the service due to having no other symptoms and states the numbness cleared around 2:00 PM, but she still had numb spots in the face. Gloria Cisneros reports a few weeks ago she ran out of Plavix, so she went without it for a few days and didn't restart it until 06/15. Gloria Cisneros wanted to make Dr. Mayford Knife aware of this due to believing it was due to the lack of Plavix. Please advise.

## 2020-08-13 NOTE — Telephone Encounter (Signed)
Spoke with the patient and advised her on Dr. Norris Cross recommendations. Patient is not able to see her PCP today. Patient states that she is feeling better. She states that she has residual facial droop from her previous stroke that comes on when she gets tired. Advised patient that her additional symptoms of numbness and blurry vision were also concerning.  Patient states that she will go to an urgent care later today.

## 2020-10-15 ENCOUNTER — Other Ambulatory Visit: Payer: Self-pay

## 2020-10-15 ENCOUNTER — Telehealth: Payer: Self-pay | Admitting: Cardiology

## 2020-10-15 ENCOUNTER — Other Ambulatory Visit: Payer: Self-pay | Admitting: *Deleted

## 2020-10-15 LAB — ALT: ALT: 14 IU/L (ref 0–32)

## 2020-10-15 LAB — LIPID PANEL
Chol/HDL Ratio: 5.4 ratio — ABNORMAL HIGH (ref 0.0–4.4)
Cholesterol, Total: 152 mg/dL (ref 100–199)
HDL: 28 mg/dL — ABNORMAL LOW (ref >39)
LDL Chol Calc (NIH): 87 mg/dL (ref 0–99)
Triglycerides: 219 mg/dL — ABNORMAL HIGH (ref 0–149)
VLDL Cholesterol Cal: 37 mg/dL (ref 5–40)

## 2020-10-15 MED ORDER — CLOPIDOGREL BISULFATE 75 MG PO TABS: 75.0000 mg | ORAL_TABLET | Freq: Every day | ORAL | 0 refills | Status: DC

## 2020-10-15 NOTE — Telephone Encounter (Signed)
Refill for Plavix has been sent to [facility] pharmacy per pt's request.

## 2020-10-15 NOTE — Telephone Encounter (Signed)
*  STAT* If patient is at the pharmacy, call can be transferred to refill team.   1. Which medications need to be refilled? (please list name of each medication and dose if known) clopidogrel 75 mg  2. Which pharmacy/location (including street and city if local pharmacy) is medication to be sent to? [facility] [place]  3. Do they need a 30 day or 90 day supply?  90days Has appt on 10/26/20 with Turner , needs some until then .

## 2020-10-19 ENCOUNTER — Telehealth: Payer: Self-pay | Admitting: Cardiology

## 2020-10-19 MED ORDER — ROSUVASTATIN CALCIUM 5 MG PO TABS: 5.0000 mg | ORAL_TABLET | ORAL | 3 refills | Status: DC

## 2020-10-19 NOTE — Telephone Encounter (Signed)
   Pt is returning call, she said she push the wrong button and not able to answer Carly's call

## 2020-10-19 NOTE — Telephone Encounter (Signed)
Gloria Reichert, MD  10/17/2020 10:47 PM EDT     Lipids still not at goal.  Please forward to lipid clinic for further recommendations.   Left message for patient to call back.

## 2020-10-19 NOTE — Addendum Note (Signed)
Addended by: Theresia Majors on: 10/19/2020 02:09 PM   Modules accepted: Orders

## 2020-10-19 NOTE — Telephone Encounter (Signed)
Olene Floss, RPH-CPP  10/19/2020  1:12 PM EDT Back to Top    Would recommend switching simvastatin to rosuvastatin 5mg  every other day. She should consider having her A1C checked and really try to focus on limiting consumption of breads, pastas, rice, potatoes, corn and processed foods. Can discuss this in more detail with patient if she desires.   The patient has been notified of the result and verbalized understanding.  All questions (if any) were answered. , RN 10/19/2020 2:04 PM

## 2020-10-19 NOTE — Telephone Encounter (Signed)
?  Pt is returning call to get result ?

## 2020-10-25 ENCOUNTER — Telehealth: Payer: Self-pay

## 2020-10-25 DIAGNOSIS — I712 Thoracic aortic aneurysm, without rupture: Secondary | ICD-10-CM

## 2020-10-25 DIAGNOSIS — I10 Essential (primary) hypertension: Secondary | ICD-10-CM

## 2020-10-25 DIAGNOSIS — E7800 Pure hypercholesterolemia, unspecified: Secondary | ICD-10-CM

## 2020-10-25 DIAGNOSIS — Z720 Tobacco use: Secondary | ICD-10-CM

## 2020-10-25 DIAGNOSIS — I7121 Aneurysm of the ascending aorta, without rupture: Secondary | ICD-10-CM

## 2020-10-25 DIAGNOSIS — E785 Hyperlipidemia, unspecified: Secondary | ICD-10-CM

## 2020-10-25 NOTE — Telephone Encounter (Signed)
-----   Message from Quintella Reichert, MD sent at 10/19/2020  7:18 PM EDT ----- Agree with plan.  PLease repeat FLP and ALT as well as HbA1C in 8 weeks ----- Message ----- From: Olene Floss, RPH-CPP Sent: 10/19/2020   1:12 PM EDT To: Quintella Reichert, MD, Theresia Majors, RN  Would recommend switching simvastatin to rosuvastatin 5mg  every other day. She should consider having her A1C checked and really try to focus on limiting consumption of breads, pastas, rice, potatoes, corn and processed foods. Can discuss this in more detail with patient if she desires.

## 2020-10-25 NOTE — Telephone Encounter (Signed)
Lab work has been scheduled.  ?

## 2020-10-26 ENCOUNTER — Ambulatory Visit: Payer: Self-pay | Admitting: Cardiology

## 2020-11-02 ENCOUNTER — Encounter: Payer: Self-pay | Admitting: Cardiology

## 2020-11-02 ENCOUNTER — Other Ambulatory Visit: Payer: Self-pay

## 2020-11-02 ENCOUNTER — Ambulatory Visit (INDEPENDENT_AMBULATORY_CARE_PROVIDER_SITE_OTHER): Payer: Self-pay | Admitting: Cardiology

## 2020-11-02 VITALS — BP 136/84 | HR 78 | Ht 63.0 in | Wt 225.0 lb

## 2020-11-02 DIAGNOSIS — E7800 Pure hypercholesterolemia, unspecified: Secondary | ICD-10-CM

## 2020-11-02 DIAGNOSIS — R0789 Other chest pain: Secondary | ICD-10-CM

## 2020-11-02 DIAGNOSIS — Q211 Atrial septal defect: Secondary | ICD-10-CM

## 2020-11-02 DIAGNOSIS — I712 Thoracic aortic aneurysm, without rupture: Secondary | ICD-10-CM

## 2020-11-02 DIAGNOSIS — I340 Nonrheumatic mitral (valve) insufficiency: Secondary | ICD-10-CM

## 2020-11-02 DIAGNOSIS — I7121 Aneurysm of the ascending aorta, without rupture: Secondary | ICD-10-CM

## 2020-11-02 DIAGNOSIS — I10 Essential (primary) hypertension: Secondary | ICD-10-CM

## 2020-11-02 DIAGNOSIS — Q2112 Patent foramen ovale: Secondary | ICD-10-CM

## 2020-11-02 NOTE — Progress Notes (Signed)
Date:  11/02/2020   ID:  KYLI SORTER, DOB 11/17/1965, MRN [id number]  PCP:  Kaleen Mask, MD  Cardiologist:  Armanda Magic, MD Electrophysiologist:  None   Chief Complaint:  Hypertension and MR  History of Present Illness:    Gloria Cisneros is a 55 y.o. female with a hx of mitral regurgitation and HTN.  She also has a history of CP with normal stress test, PFO and CVA.  She has had problems with varying formulations of lopressor in the past causing CP and SOB which stopped after she switched companies. She had a coronary CTA in 2018 that showed no CAD and a calcium score of 0.  A nuclear stress test which done a year ago which showed no ischemia.  2D echo a year ago showed normal LV function with EF 60 to 65% with grade 1 diastolic dysfunction and mildly dilated ascending aorta at 40 mm.  She is here today for followup and is doing well.  She had COVID 19 about 9 days ago and has a cough still.  She has some mild chest discomfort with coughing but no other CP.  She denies any SOB, DOE, PND, orthopnea, LE edema, dizziness (except with COVID 19) palpitations or syncope. She is compliant with her meds and is tolerating meds with no SE.     Prior CV studies:   The following studies were reviewed today:  Cardiac MRI 09/2018 FINDINGS: The atria were of normal size. Normal RV size and function Redundant and mobile atrial septum with no obvious PFO noted. Normal LV size and function Quantitative EF 76% (EDV 135 cc ESV 33 cc SV 102 cc)   No significant LVH with septal thickness 11 mm No delayed enhancement or infarct on post gadolinium images. Mild MR No significant AR/AS. Tri leaflet AV.   Aortic MRA: Mild ascending aortic root dilatation stable 4.1 cm in ascending root. Normal arch vessels. Arch measures 2.4 cm and descending thoracic aorta measures 2.3 cm   IMPRESSION: 1.  Stable ascending aortic root dilatation 4.1 cm   2.  Normal LV size and function EF 76% with no LVH  septum 11 mm   3.  No delayed hyper-enhancement on post gadolinium images   4.  Redundant and mobile atrial septum no obvious PFO  2D echo 08/2018 IMPRESSIONS    1. The left ventricle has normal systolic function, with an ejection  fraction of 60-65%. The cavity size was normal. There is moderate  asymmetric left ventricular hypertrophy. Left ventricular diastolic  Doppler parameters are consistent with impaired  relaxation.   2. The right ventricle has normal systolc function. The cavity was  normal. There is no increase in right ventricular wall thickness. Right  ventricular systolic pressure could not be assessed.   3. No stenosis of the aortic valve.   4. There is mild dilatation of the ascending aorta measuring 42 mm. I  have independently reviewed the images from 05/29/2016 echo and 05/11/2017  echo. Independent remeasurement of 05/29/2016 ascending aorta is 41 mm.  Unable to remeasure 2019 study which is  40 mm by image review. Likely no significant change has occured over  serial exams.   Past Medical History:  Diagnosis Date   Chest pain, non-cardiac    coronary CTA with no CAD and Ca score of 0 in 2018   Dilated aortic root (HCC)    4.1cm by MRI 09/2018 and ascending aorta 4.2cm.   Hyperlipidemia LDL goal <70 06/27/2018  Hypertension    Mitral valve regurgitation    PFO (patent foramen ovale)    by echo 07/2014 but not present on Cardiac MRI 2020   TIA (transient ischemic attack) 07/2014   Past Surgical History:  Procedure Laterality Date   ABDOMINAL HYSTERECTOMY     ABDOMINAL SURGERY     ECTOPIC PREGNANCY SURGERY     INSERTION OF MESH     OOPHORECTOMY       Current Meds  Medication Sig   acetaminophen (TYLENOL) 500 MG tablet Take 500 mg by mouth every 6 (six) hours as needed.   buPROPion (WELLBUTRIN SR) 150 MG 12 hr tablet Take 1 tablet by mouth once daily for 3 days then increase to 1 tablet by mouth twice daily. (Patient taking differently: Take 150 mg by mouth  daily.)   clopidogrel (PLAVIX) 75 MG tablet Take 1 tablet (75 mg total) by mouth daily.   loratadine (CLARITIN REDITABS) 10 MG dissolvable tablet Take 10 mg by mouth as needed for allergies.   metoprolol tartrate (LOPRESSOR) 25 MG tablet Take 1 tablet (25 mg total) by mouth 2 (two) times daily.   nitroGLYCERIN (NITROSTAT) 0.4 MG SL tablet Place 1 tablet (0.4 mg total) under the tongue every 5 (five) minutes as needed for chest pain.     Allergies:   Aspirin, Coconut oil, Salvia officinalis, and Atorvastatin   Social History   Tobacco Use   Smoking status: Every Day    Packs/day: 0.25    Types: Cigarettes   Smokeless tobacco: Never   Tobacco comments:    smokes 3 cigarettes a day  Vaping Use   Vaping Use: Former  Substance Use Topics   Alcohol use: Yes    Comment: 0.5 beer every 6-7 months (2 oz roughly)   Drug use: No     Family Hx: The patient's family history includes Cancer in her brother; Hyperlipidemia in her mother; Hypertension in her father.  ROS:   Please see the history of present illness.     All other systems reviewed and are negative.   Labs/Other Tests and Data Reviewed:    Recent Labs: 10/15/2020: ALT 14   Recent Lipid Panel Lab Results  Component Value Date/Time   CHOL 152 10/15/2020 08:31 AM   TRIG 219 (H) 10/15/2020 08:31 AM   HDL 28 (L) 10/15/2020 08:31 AM   CHOLHDL 5.4 (H) 10/15/2020 08:31 AM   LDLCALC 87 10/15/2020 08:31 AM    Wt Readings from Last 3 Encounters:  11/02/20 225 lb (102.1 kg)  10/14/19 222 lb (100.7 kg)  06/28/18 211 lb 9.6 oz (96 kg)     Objective:    Vital Signs:  BP 136/84   Pulse 78   Ht 5\' 3"  (1.6 m)   Wt 225 lb (102.1 kg)   SpO2 98%   BMI 39.86 kg/m   GEN: Well nourished, well developed in no acute distress HEENT: Normal NECK: No JVD; No carotid bruits LYMPHATICS: No lymphadenopathy CARDIAC:RRR, no murmurs, rubs, gallops RESPIRATORY:  Clear to auscultation without rales, wheezing or rhonchi  ABDOMEN: Soft,  non-tender, non-distended MUSCULOSKELETAL:  No edema; No deformity  SKIN: Warm and dry NEUROLOGIC:  Alert and oriented x 3 PSYCHIATRIC:  Normal affect    EKG was performed in office today and showed NSR with no ST changes  ASSESSMENT & PLAN:    1.  Mild mitral regurgitation  - no MR was noted on echo 08/2018  2.  Hypertension  -BP is controlled on exam today -  Continue prescription drug management with Lopressor 25mg  BID > refilled   3.  PFO  - this is been noted on prior echoes but on last echo 2019 there was no evidence of PFO by color flow Doppler and right-sided pressures and chamber size were normal.   -no PFO by cardiac MRI 2020 -she does have a history of CVA and has been on Plavix.  4.  Mildly dilated ascending aorta  -2D echo 08/2018 showed an ascending aorta of 34mm in diameter and cardiac MRI showed a diameter of 4.1cm . -repeat echo to followup  5.  Hyperlipidemia  -LDL goal is < 70 -I have personally reviewed and interpreted outside labs performed by patient's PCP which showed LDL 87, HDL 28, TAG 210 and ALT 14 in Sept 9 2022  -I placed an order for her to start Crestor 5mg  qod but she has not gotten it refilled yet -I encouraged her to fill her Crestor and start it and will repeat FLP and ALT in 6 weeks   6.  Noncardiac chest pain -coronary CTA showed no CAD and Ca score of 0 -resolved  Patient Risk:   After full review of this patient's clinical status, I feel that they are at least moderate risk at this time.  Time:   Today, I have spent 20 minutes on telemedicine discussing medical problems including HTN, dilated aorta, lipids, PFO and reviewing patient's chart including 2D echo and stress test from 2019.  Medication Adjustments/Labs and Tests Ordered: Current medicines are reviewed at length with the patient today.  Concerns regarding medicines are outlined above.  Tests Ordered: Orders Placed This Encounter  Procedures   EKG 12-Lead    Medication  Changes: No orders of the defined types were placed in this encounter.   Disposition:  Follow up in 1 year(s)  Signed, , MD  11/02/2020 2:07 PM    [facility]

## 2020-11-02 NOTE — Addendum Note (Signed)
Addended by: Theresia Majors on: 11/02/2020 02:15 PM   Modules accepted: Orders

## 2020-11-02 NOTE — Patient Instructions (Signed)
Medication Instructions:  Your physician has recommended you make the following change in your medication:   1) START taking Crestor (rosuvastatin) 5 mg every other day *If you need a refill on your cardiac medications before your next appointment, please call your pharmacy*   Lab Work: Fasting lipids and ALT in 6 weeks.  If you have labs (blood work) drawn today and your tests are completely normal, you will receive your results only by: MyChart Message (if you have MyChart) OR A paper copy in the mail If you have any lab test that is abnormal or we need to change your treatment, we will call you to review the results.   Testing/Procedures: Your physician has requested that you have an echocardiogram. Echocardiography is a painless test that uses sound waves to create images of your heart. It provides your doctor with information about the size and shape of your heart and how well your heart's chambers and valves are working. This procedure takes approximately one hour. There are no restrictions for this procedure.   Follow-Up: At [facility], you and your health needs are our priority.  As part of our continuing mission to provide you with exceptional heart care, we have created designated Provider Care Teams.  These Care Teams include your primary Cardiologist (physician) and Advanced Practice Providers (APPs -  Physician Assistants and Nurse Practitioners) who all work together to provide you with the care you need, when you need it.  Your next appointment:   1 year(s)  The format for your next appointment:   In Person  Provider:   You may see Armanda Magic, MD or one of the following Advanced Practice Providers on your designated Care Team:   Ronie Spies, PA-C Jacolyn Reedy, PA-C

## 2020-11-05 ENCOUNTER — Other Ambulatory Visit: Payer: Self-pay | Admitting: Cardiology

## 2020-11-16 ENCOUNTER — Telehealth: Payer: Self-pay

## 2020-11-16 ENCOUNTER — Ambulatory Visit (HOSPITAL_COMMUNITY): Payer: Self-pay | Attending: Cardiovascular Disease

## 2020-11-16 ENCOUNTER — Other Ambulatory Visit: Payer: Self-pay

## 2020-11-16 DIAGNOSIS — I340 Nonrheumatic mitral (valve) insufficiency: Secondary | ICD-10-CM | POA: Insufficient documentation

## 2020-11-16 DIAGNOSIS — I7121 Aneurysm of the ascending aorta, without rupture: Secondary | ICD-10-CM | POA: Insufficient documentation

## 2020-11-16 DIAGNOSIS — E7800 Pure hypercholesterolemia, unspecified: Secondary | ICD-10-CM | POA: Insufficient documentation

## 2020-11-16 DIAGNOSIS — I10 Essential (primary) hypertension: Secondary | ICD-10-CM | POA: Insufficient documentation

## 2020-11-16 DIAGNOSIS — R0789 Other chest pain: Secondary | ICD-10-CM | POA: Insufficient documentation

## 2020-11-16 DIAGNOSIS — Q2112 Patent foramen ovale: Secondary | ICD-10-CM | POA: Insufficient documentation

## 2020-11-16 LAB — ECHOCARDIOGRAM COMPLETE
Area-P 1/2: 3.65 cm2
S' Lateral: 2.7 cm

## 2020-11-16 NOTE — Telephone Encounter (Signed)
The patient has been notified of the result and verbalized understanding.  All questions (if any) were answered. Theresia Majors, RN 11/16/2020 4:21 PM  MRI/MRA has been ordered.

## 2020-11-16 NOTE — Telephone Encounter (Signed)
-----   Message from Quintella Reichert, MD sent at 11/16/2020 11:27 AM EDT ----- Could not see aorta well on echo so get chest MRI MRA gated for aortic dilatation

## 2020-12-14 ENCOUNTER — Other Ambulatory Visit (HOSPITAL_COMMUNITY): Payer: Self-pay | Admitting: Emergency Medicine

## 2020-12-14 DIAGNOSIS — I7121 Aneurysm of the ascending aorta, without rupture: Secondary | ICD-10-CM

## 2020-12-15 ENCOUNTER — Telehealth (HOSPITAL_COMMUNITY): Payer: Self-pay | Admitting: Emergency Medicine

## 2020-12-15 ENCOUNTER — Other Ambulatory Visit (HOSPITAL_COMMUNITY): Payer: Self-pay | Admitting: Emergency Medicine

## 2020-12-15 DIAGNOSIS — I7121 Aneurysm of the ascending aorta, without rupture: Secondary | ICD-10-CM

## 2020-12-15 NOTE — Telephone Encounter (Signed)
Attempted to call patient regarding upcoming cardiac MR appointment. Left message on voicemail with name and callback number Savi Lastinger RN Navigator Cardiac Imaging [facility] [phone] Office [phone] Cell  Reminded to get labs 
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
## 2020-12-16 ENCOUNTER — Telehealth: Payer: Self-pay | Admitting: Cardiology

## 2020-12-16 DIAGNOSIS — E7800 Pure hypercholesterolemia, unspecified: Secondary | ICD-10-CM

## 2020-12-16 NOTE — Telephone Encounter (Signed)
   Pt c/o medication issue:  1. Name of Medication: rosuvastatin (CRESTOR) 5 MG tablet  2. How are you currently taking this medication (dosage and times per day)? Take 1 tablet (5 mg total) by mouth every other day.Patient not taking: Reported on 11/02/2020  3. Are you having a reaction (difficulty breathing--STAT)?   4. What is your medication issue? Pt said she stopped taking this meds because its affecting her kidney, she wanted to ask Dr. Mayford Knife if she really need to be on this meds. She also cancelled her MRI and labs appt, she said she can't afford to get the MRI it will cost her $3,000, she wanted to ask Dr. Mayford Knife if there's somewhere else she can get it cheaper

## 2020-12-16 NOTE — Telephone Encounter (Signed)
Spoke with the patient and scheduled her for an appointment in lipid clinic.

## 2020-12-16 NOTE — Telephone Encounter (Signed)
Spoke with the patient who reports that she has to cancel her MRI because she is not able to afford it.  Patient also reports that she stopped taking her rosuvastatin. She states that she has a history of kidney issues and has been having kidney pain for several months now. She states that when she started the rosuvastatin her pain worsened on the days after she was taking it. She has switched back to taking simvastatin every other day. She states that last week she had some blood in her urine. She held her Plavix for one day. She has not had any more blood in her urine. She states that she does still have some pain in the lower sides of her back. I have encouraged the patient to get in touch with her PCP ASAP in regards to kidney concerns.

## 2020-12-17 ENCOUNTER — Ambulatory Visit (HOSPITAL_COMMUNITY): Payer: Self-pay

## 2020-12-17 ENCOUNTER — Ambulatory Visit (HOSPITAL_COMMUNITY): Admission: RE | Admit: 2020-12-17 | Payer: Self-pay | Source: Ambulatory Visit

## 2020-12-22 ENCOUNTER — Other Ambulatory Visit: Payer: Self-pay

## 2020-12-29 ENCOUNTER — Other Ambulatory Visit: Payer: Self-pay

## 2020-12-29 ENCOUNTER — Telehealth: Payer: Self-pay | Admitting: *Deleted

## 2020-12-29 ENCOUNTER — Ambulatory Visit: Payer: Self-pay

## 2020-12-29 NOTE — Telephone Encounter (Signed)
Received call from front desk. Pt arrived at 1:17pm for 3:30pm lipid visit. Pt upset and thought her visit was at 1:30pm. Apparently pt left and did not want to be called to reschedule visit.

## 2020-12-29 NOTE — Progress Notes (Deleted)
Patient ID: [name]                 DOB: Jan 29, 1966                    MRN: [id number]     HPI: Gloria Cisneros is a 55 y.o. female patient referred to lipid clinic by Dr. Mayford Knife. PMH is significant for CVA, HTN, mitral regurgitation, CAC of 0 in 2018 and HLD.   Current Medications:  Intolerances: atorvastatin (joint pain) atorvastatin 10 mg daily (elbow pain), pravastatin 40 mg daily (fatigue), rosuvastatin 10 mg daily (cost), simvastatin 20 mg daily (fatigue), pitavastatin 2 mg daily (fatigue) Risk Factors:  LDL goal:   Diet:   Exercise:   Family History:   Social History:   Labs: LDL 87, HDL 28, TAG 210 and ALT 14 in Sept 9 2022   Past Medical History:  Diagnosis Date   Chest pain, non-cardiac    coronary CTA with no CAD and Ca score of 0 in 2018   Dilated aortic root (HCC)    4.1cm by MRI 09/2018 and ascending aorta 4.2cm.   Hyperlipidemia LDL goal <70 06/27/2018   Hypertension    Mitral valve regurgitation    PFO (patent foramen ovale)    by echo 07/2014 but not present on Cardiac MRI 2020   TIA (transient ischemic attack) 07/2014    Current Outpatient Medications on File Prior to Visit  Medication Sig Dispense Refill   acetaminophen (TYLENOL) 500 MG tablet Take 500 mg by mouth every 6 (six) hours as needed.     buPROPion (WELLBUTRIN SR) 150 MG 12 hr tablet Take 1 tablet by mouth once daily for 3 days then increase to 1 tablet by mouth twice daily. (Patient taking differently: Take 150 mg by mouth daily.) 180 tablet 3   clopidogrel (PLAVIX) 75 MG tablet Take 1 tablet (75 mg total) by mouth daily. 90 tablet 0   loratadine (CLARITIN REDITABS) 10 MG dissolvable tablet Take 10 mg by mouth as needed for allergies.     metoprolol tartrate (LOPRESSOR) 25 MG tablet TAKE 1 TABLET BY MOUTH TWICE A DAY 180 tablet 3   nitroGLYCERIN (NITROSTAT) 0.4 MG SL tablet Place 1 tablet (0.4 mg total) under the tongue every 5 (five) minutes as needed for chest pain. 15 tablet 3    rosuvastatin (CRESTOR) 5 MG tablet Take 1 tablet (5 mg total) by mouth every other day. (Patient not taking: Reported on 11/02/2020) 45 tablet 3   No current facility-administered medications on file prior to visit.    Allergies  Allergen Reactions   Aspirin Swelling    Especially Tongue.    Coconut Oil Anaphylaxis   Salvia Officinalis Anaphylaxis   Atorvastatin Other (See Comments)    Joint pain    Assessment/Plan:  1. Hyperlipidemia -    Thank you,   Olene Floss, Pharm.D, BCPS, CPP [facility]  [address], [place], [place] [address]  Phone: [phone]; Fax: [phone]

## 2020-12-29 NOTE — Telephone Encounter (Signed)
Called patient who was upset that wasn't pulled back to see pharmacist at 1:30 pm because her time is valuable also as she stated. After researching, reasoning that patient wasn't pulled back was because she was early and had her times mixed up. Patient appointment time was 3:30. Patient stated well she wont be at appointment and if she is billed she goona.............and got quiet. Patient stated who ever scheduled her told her 1:30 pm  and she wrote it down. All her appointments are scheduled around 1 oclk hour. Patient was asked if she would like to be rescheduled she stated no. Patient was apologized to and told to have Happy Thanksgiving. Patient hung up phone in ear.

## 2021-01-19 ENCOUNTER — Other Ambulatory Visit: Payer: Self-pay | Admitting: *Deleted

## 2021-01-19 MED ORDER — CLOPIDOGREL BISULFATE 75 MG PO TABS: 75.0000 mg | ORAL_TABLET | Freq: Every day | ORAL | 0 refills | Status: DC

## 2021-05-10 ENCOUNTER — Other Ambulatory Visit: Payer: Self-pay

## 2021-05-10 MED ORDER — CLOPIDOGREL BISULFATE 75 MG PO TABS: 75.0000 mg | ORAL_TABLET | Freq: Every day | ORAL | 1 refills | Status: DC

## 2021-06-19 IMAGING — MR MR CARD MORPHOLOGY WO/W CM
25 of 29 series · 36 of 40 positions shown · IV contrast (Gadavist)
Comparison: none

CLINICAL DATA: LVH Aortic Root Dilatation

EXAM:
CARDIAC MRI
TECHNIQUE: The patient was scanned on a 1.5 Tesla Siemens magnet. A dedicated
cardiac coil was used. Functional imaging was done using Fiesta
sequences. [DATE], and 4 chamber views were done to assess for RWMA's.
Modified Klaus rule using a short axis stack was used to
calculate an ejection fraction on a dedicated work station using
Circle software. The patient received Gadavist After 10 minutes
inversion recovery sequences were used to assess for infiltration
and scar tissue.
CONTRAST:  Gadavist

[Series 6: bSSFP · oblique · 8.0mm · 1.52mm/px · 9 of 425 slices shown (1 of 4)]
[im 1/425]
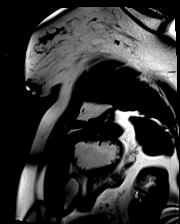
[im 54/425]
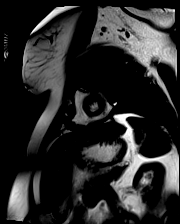
[im 107/425]
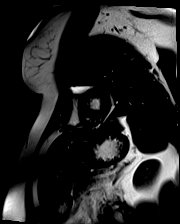
[im 160/425]
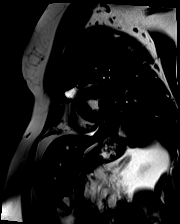
[im 213/425]
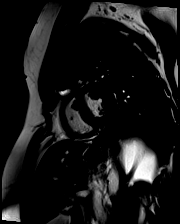
[im 266/425]
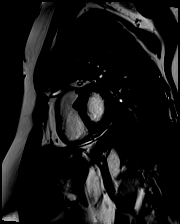
[im 319/425]
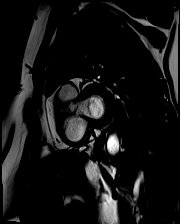
[im 372/425]
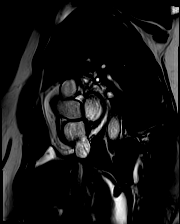
[im 425/425]
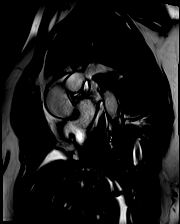

[Series 7: bSSFP · axial · 6.0mm · 1.41mm/px · 1 of 25 slices shown (2 of 4)]
[im 1/25]
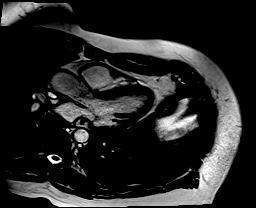

[Series 8: bSSFP · oblique · 6.0mm · 1.41mm/px · 1 of 25 slices shown (3 of 4)]
[im 1/25]
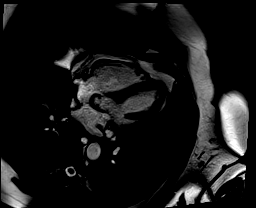

[Series 9: bSSFP · oblique · 6.0mm · 1.41mm/px · 1 of 25 slices shown (4 of 4)]
[im 1/25]
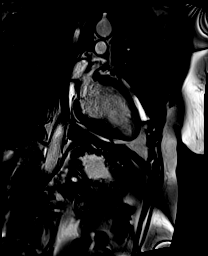

[Series 10: t2_trufi_tra_p2_bh · axial · 8.0mm · 0.53mm/px · 1 of 17 slices shown (1 of 3)]
[im 1/17]
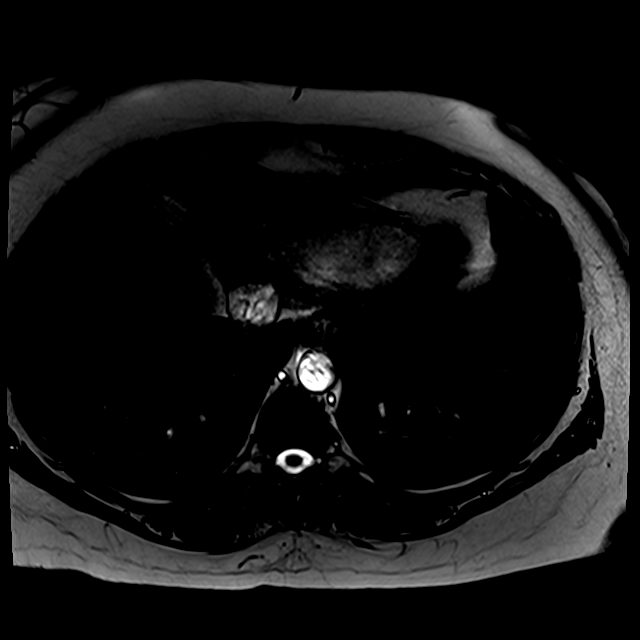

[Series 11: axial_db_haste_loc · axial · 7.0mm · 1.48mm/px · 1 of 22 slices shown]
[im 1/22]
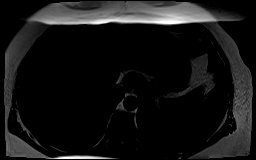

[Series 12: t2_trufi_tra_p2_bh · axial · 8.0mm · 0.53mm/px · 1 of 20 slices shown (2 of 3)]
[im 1/20]
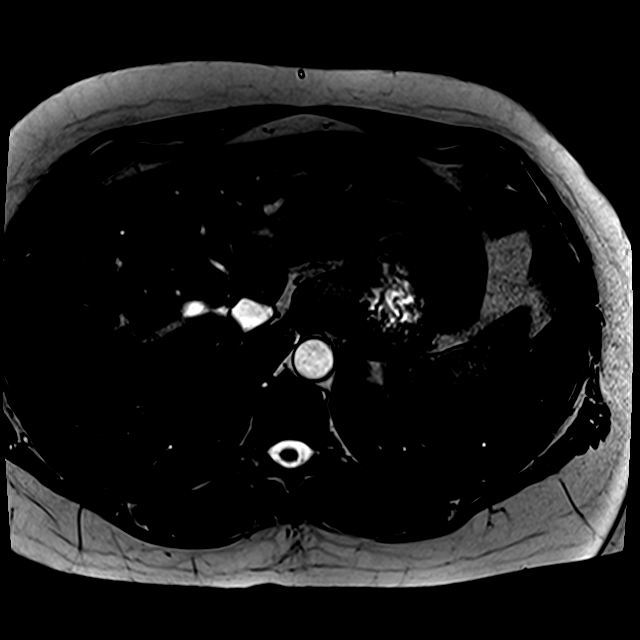

[Series 13: (person_name)_(person_name)_(person_name) · sagittal · 8.0mm · 1.79mm/px · 1 of 22 slices shown (1 of 2)]
[im 1/22]
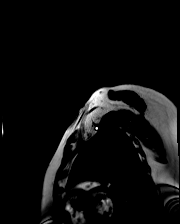

[Series 14: (person_name)_(person_name)_(person_name) · sagittal · 8.0mm · 1.79mm/px · 2 of 147 slices shown (2 of 2)]
[im 1/147]
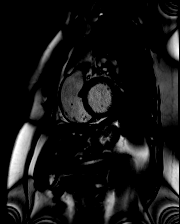
[im 147/147]
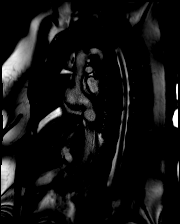

[Series 15: t2_trufi_tra_p2_bh · axial · 8.0mm · 0.53mm/px · 1 of 20 slices shown (3 of 3)]
[im 1/20]
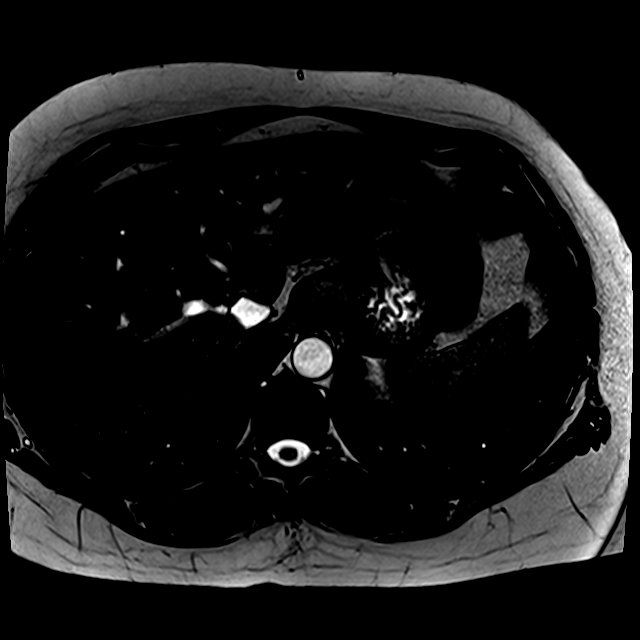

[Series 16: T1 dynamic · axial · non-contrast · 3.3mm · 1.18mm/px · 1 of 80 slices shown]
[im 1/80]
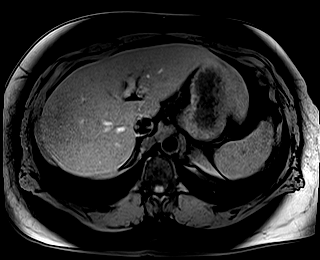

[Series 17: angio_fl3d_sag_pre · sagittal · 1.1mm · 0.99mm/px · 1 of 96 slices shown]
[im 1/96]
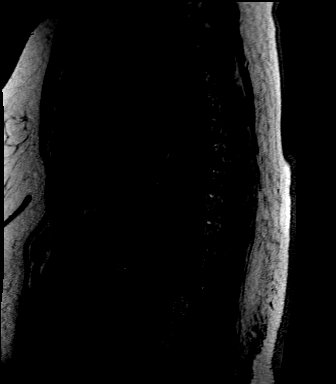

[Series 19: candy cane ce-arterial · sagittal · arterial · 1.1mm · 0.99mm/px · 1 of 96 slices shown]
[im 1/96]
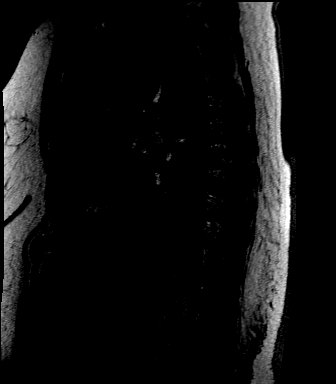

[Series 20: candy cane ce-arterial_sub · sagittal · 1.1mm · 0.99mm/px · 1 of 94 slices shown]
[im 1/94]
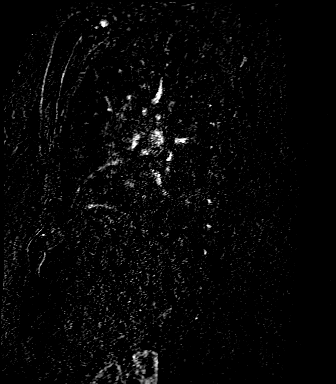

[Series 22: candy cane ce-venous · sagittal · portal-venous · 1.1mm · 0.99mm/px · 2 of 96 slices shown]
[im 1/96]
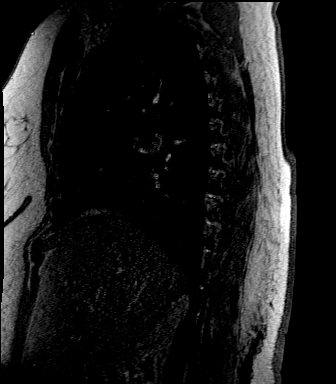
[im 96/96]
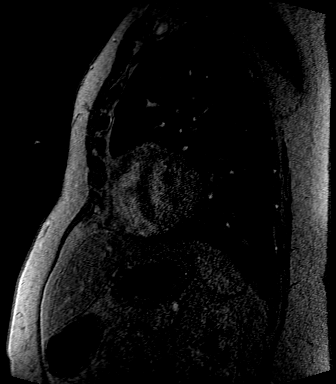

[Series 23: candy cane ce-venous_sub · sagittal · 1.1mm · 0.99mm/px · 2 of 96 slices shown]
[im 1/96]
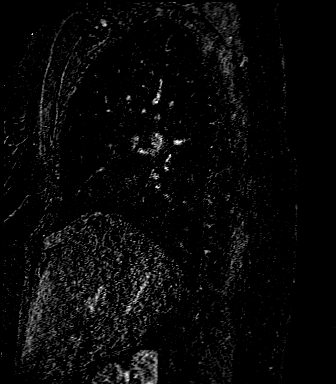
[im 96/96]
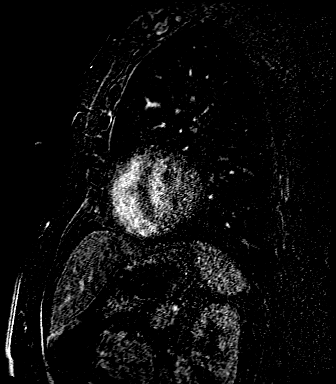

[Series 25: T1 dynamic post-contrast · axial · 3.3mm · 1.18mm/px · 1 of 80 slices shown]
[im 1/80]
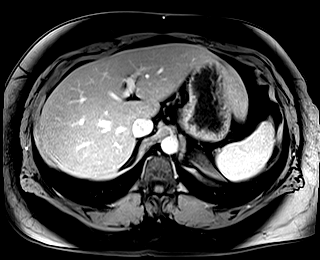

[Series 27: lge_single shot sa · oblique · 8.0mm · 1.98mm/px · 1 of 19 slices shown (1 of 2)]
[im 1/19]
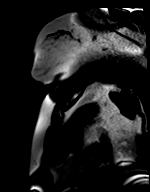

[Series 28: lge_single shot sa · oblique · 8.0mm · 1.98mm/px · 1 of 19 slices shown (2 of 2)]
[im 1/19]
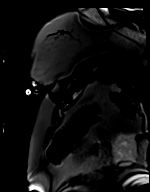

[Series 29: lge_single shot radial_mag · axial · 6.0mm · 1.98mm/px · 1 of 1 slices shown]
[im 1/1]
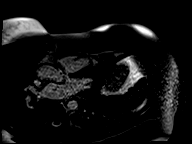

[Series 30: lge_single shot radial_psir · axial · 6.0mm · 1.98mm/px · 1 of 1 slices shown]
[im 1/1]
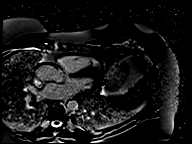

[Series 36: lge short axis_mag · oblique · 6.0mm · 1.61mm/px · 1 of 24 slices shown]
[im 1/24]
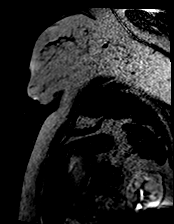

[Series 37: lge short axis_psir · oblique · 6.0mm · 1.61mm/px · 1 of 24 slices shown]
[im 1/24]
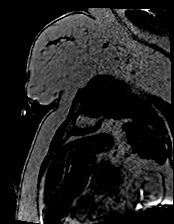

[Series 38: lge radial ((date)ch)_mag · axial · 6.0mm · 1.61mm/px · 1 of 1 slices shown]
[im 1/1]
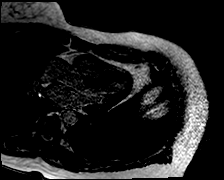

[Series 39: lge radial ((date)ch)_psir · axial · 6.0mm · 1.61mm/px · 1 of 1 slices shown]
[im 1/1]
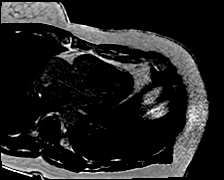

[36 of 40 positions shown; findings below may reference images not displayed]

FINDINGS: The atria were of normal size. Normal RV size and function Redundant
and mobile atrial septum with no obvious PFO noted. Normal LV size
and function Quantitative EF 76% (EDV 135 cc ESV 33 cc SV 102 cc)

No significant LVH with septal thickness 11 mm No delayed
enhancement or infarct on post gadolinium images. Mild MR No
significant AR/AS. Tri leaflet AV.

Aortic MRA: Mild ascending aortic root dilatation stable 4.1 cm in
ascending root. Normal arch vessels. Arch measures 2.4 cm and
descending thoracic aorta measures 2.3 cm
IMPRESSION: 1.  Stable ascending aortic root dilatation 4.1 cm

2.  Normal LV size and function EF 76% with no LVH septum 11 mm

3.  No delayed hyper-enhancement on post gadolinium images

4.  Redundant and mobile atrial septum no obvious PFO

Veden Kayava

## 2021-10-13 ENCOUNTER — Telehealth: Payer: Self-pay | Admitting: Cardiology

## 2021-10-13 NOTE — Telephone Encounter (Unsigned)
The patient states she had dizziness and blurred vision when she woke up today and it did not go away until around lunch time. Other symptoms are chest pain and arm ache. Which have not occurred today. She has no symptoms at this time. She complains that all of this is coming from her metoprolol tartrate medication, stating the manufacture changed to Advagena at [facility]. She states she can only take the [employer] brand. These same side effects happen to her in the past with the same symptoms as she is having now and when she got the [employer] back they went away.  The symptoms started about two weeks after they changed her manufacture which took place 3 months ago. She would like to know if we can help her find a pharmacy that has Caraco because she has called several and can not find it.  The chest pain started off mild and is increasing over time. Yesterday 9/6 "it was enough to cause alarm, starts as quick shooting pain then turns into aching." Her arm ache can be either arm or even parts of her legs. All symptoms can occur 1-3 times a week. Patient states she thought about going to the ER but she does not have the money to go. Her insurance will not cover heart problems until April of 2024. Patient states she has not taken nitro. (Looks like prescription expired in 2022)    ER precautions given. Patient verbalized understanding and agreement.

## 2021-10-13 NOTE — Telephone Encounter (Signed)
Pt c/o medication issue:  1. Name of Medication: Metoprolol that she takes from  a certain base  2. How are you currently taking this medication (dosage and times per day)?   3. Are you having a reaction (difficulty breathing--STAT)?   4. What is your medication issue? Dizziness, blurred vision and chest  and arm been aching

## 2021-10-14 NOTE — Telephone Encounter (Signed)
I did check with [facility]. 50mg  is on backorder. 25mg  is only available in 1000 count and they are unsure if they will be able to order.  Patient made aware and I will update her when I hear back. Patient states she has tried several other brands and this is the only one that has worked for her without side effects.

## 2021-10-14 NOTE — Telephone Encounter (Signed)
Patient states that pain is similar to what she had several years back prior to her CT scan. Not as bad as last time.  She has not had any pain today. Last episode was yesterday around lunch time.

## 2021-10-18 NOTE — Telephone Encounter (Signed)
Spoke with the patient who is hesitant about switching to amlodipine. She states that she is very sensitive to medications. She states that she would like to think about it and she would let me know. She is also wondering if Melissa was able to find metoprolol made by [facility] anywhere.

## 2021-10-18 NOTE — Telephone Encounter (Signed)
No, I was not able to find it anywhere. I agree that switching to a amlodipine would be best.

## 2021-10-20 NOTE — Telephone Encounter (Signed)
Left message for patient to call back  

## 2021-11-21 ENCOUNTER — Telehealth: Payer: Self-pay | Admitting: Cardiology

## 2021-11-21 MED ORDER — METOPROLOL TARTRATE 25 MG PO TABS: 25.0000 mg | ORAL_TABLET | Freq: Two times a day (BID) | ORAL | 0 refills | Status: DC

## 2021-11-21 MED ORDER — CLOPIDOGREL BISULFATE 75 MG PO TABS: 75.0000 mg | ORAL_TABLET | Freq: Every day | ORAL | 0 refills | Status: DC

## 2021-11-21 NOTE — Telephone Encounter (Signed)
Pt's medications were sent to pt's pharmacy as requested. Confirmation received.  

## 2021-11-21 NOTE — Telephone Encounter (Signed)
*  STAT* If patient is at the pharmacy, call can be transferred to refill team.   1. Which medications need to be refilled? (please list name of each medication and dose if known)  clopidogrel (PLAVIX) 75 MG tablet  metoprolol tartrate (LOPRESSOR) 25 MG tablet   2. Which pharmacy/location (including street and city if local pharmacy) is medication to be sent to? [facility] [address] [place], [place]. AT [place]  3. Do they need a 30 day or 90 day supply? Needs enough medication to last her until 01/29 appt

## 2021-11-25 ENCOUNTER — Other Ambulatory Visit: Payer: Self-pay | Admitting: Cardiology

## 2022-03-06 ENCOUNTER — Ambulatory Visit: Payer: Self-pay | Attending: Cardiology | Admitting: Cardiology

## 2022-03-06 ENCOUNTER — Encounter: Payer: Self-pay | Admitting: Cardiology

## 2022-03-06 VITALS — BP 148/90 | HR 70 | Ht 65.0 in | Wt 228.4 lb

## 2022-03-06 DIAGNOSIS — R079 Chest pain, unspecified: Secondary | ICD-10-CM

## 2022-03-06 DIAGNOSIS — I10 Essential (primary) hypertension: Secondary | ICD-10-CM

## 2022-03-06 DIAGNOSIS — I77810 Thoracic aortic ectasia: Secondary | ICD-10-CM

## 2022-03-06 DIAGNOSIS — E785 Hyperlipidemia, unspecified: Secondary | ICD-10-CM

## 2022-03-06 DIAGNOSIS — Q2112 Patent foramen ovale: Secondary | ICD-10-CM

## 2022-03-06 DIAGNOSIS — Z01812 Encounter for preprocedural laboratory examination: Secondary | ICD-10-CM

## 2022-03-06 DIAGNOSIS — I340 Nonrheumatic mitral (valve) insufficiency: Secondary | ICD-10-CM

## 2022-03-06 MED ORDER — METOPROLOL TARTRATE 100 MG PO TABS: 100.0000 mg | ORAL_TABLET | Freq: Once | ORAL | 0 refills | Status: DC

## 2022-03-06 MED ORDER — METOPROLOL TARTRATE 50 MG PO TABS: 50.0000 mg | ORAL_TABLET | Freq: Two times a day (BID) | ORAL | 3 refills | Status: DC

## 2022-03-06 NOTE — Addendum Note (Signed)
Addended by: Joni Reining on: 03/06/2022 10:28 AM   Modules accepted: Orders

## 2022-03-06 NOTE — Patient Instructions (Signed)
Medication Instructions:  Stop taking 25 mg of Lopressor. Start taking 50 mg lopressor twice a day.   *If you need a refill on your cardiac medications before your next appointment, please call your pharmacy*   Lab Work: Please complete a BMET and a FASTING lipid panel and an ALT in our lab before you leave today.  If you have labs (blood work) drawn today and your tests are completely normal, you will receive your results only by: [place] (if you have MyChart) OR A paper copy in the mail If you have any lab test that is abnormal or we need to change your treatment, we will call you to review the results.   Testing/Procedures:   Your cardiac CT will be scheduled at:  [facility] [address] [place],  [address] [phone]  Please arrive at the [facility] and Children's Entrance (Entrance C2) of [facility] 30 minutes prior to test start time. You can use the FREE valet parking offered at entrance C (encouraged to control the heart rate for the test)  Proceed to the [facility] Radiology Department (first floor) to check-in and test prep.  All radiology patients and guests should use entrance C2 at [facility], accessed from [facility], even though the hospital's physical address listed is [address].      Please follow these instructions carefully (unless otherwise directed):  Hold all erectile dysfunction medications at least 3 days (72 hrs) prior to test. (Ie viagra, cialis, sildenafil, tadalafil, etc) We will administer nitroglycerin during this exam.   On the Night Before the Test: Be sure to Drink plenty of water. Do not consume any caffeinated/decaffeinated beverages or chocolate 12 hours prior to your test. Do not take any antihistamines 12 hours prior to your test.  On the Day of the Test: Drink plenty of water until 1 hour prior to the test. Do not eat any food 1 hour prior to test. You may  take your regular medications prior to the test.  Take a one time dose of 100 mg metoprolol (Lopressor) two hours prior to test. This script has been sent to your pharmacy. HOLD Furosemide/Hydrochlorothiazide morning of the test. FEMALES- please wear underwire-free bra if available, avoid dresses & tight clothing        After the Test: Drink plenty of water. After receiving IV contrast, you may experience a mild flushed feeling. This is normal. On occasion, you may experience a mild rash up to 24 hours after the test. This is not dangerous. If this occurs, you can take Benadryl 25 mg and increase your fluid intake. If you experience trouble breathing, this can be serious. If it is severe call 911 IMMEDIATELY. If it is mild, please call our office. If you take any of these medications: Glipizide/Metformin, Avandament, Glucavance, please do not take 48 hours after completing test unless otherwise instructed.  We will call to schedule your test 2-4 weeks out understanding that some insurance companies will need an authorization prior to the service being performed.   For non-scheduling related questions, please contact the cardiac imaging nurse navigator should you have any questions/concerns: Marchia Bond, Cardiac Imaging Nurse Navigator Gordy Clement, Cardiac Imaging Nurse Navigator [facility] Direct Office Dial: [phone]   For scheduling needs, including cancellations and rescheduling, please call [place], [phone].    Follow-Up: At [facility], you and your health needs are our priority.  As part of our  continuing mission to provide you with exceptional heart care, we have created designated Provider Care Teams.  These Care Teams include your primary Cardiologist (physician) and Advanced Practice Providers (APPs -  Physician Assistants and Nurse Practitioners) who all work together to provide you with the care you need, when you need  it.  We recommend signing up for the patient portal called "MyChart".  Sign up information is provided on this After Visit Summary.  MyChart is used to connect with patients for Virtual Visits (Telemedicine).  Patients are able to view lab/test results, encounter notes, upcoming appointments, etc.  Non-urgent messages can be sent to your provider as well.   To learn more about what you can do with MyChart, go to [email or web address].    Your next appointment:   1 year(s)  Provider:   Fransico Him, MD     Other Instructions Please complete a blood pressure log. Record your blood pressure twice a day, once in the morning an hour after you have taken your blood pressure medications and once in the afternoon or evening. Do this every day for a week then call us with the results.

## 2022-03-06 NOTE — Progress Notes (Signed)
Date:  03/06/2022   ID:  Gloria Cisneros, DOB 1965-04-14, MRN [id number]  PCP:  Kaleen Mask, MD  Cardiologist:  Armanda Magic, MD Electrophysiologist:  None   Chief Complaint:  Hypertension and MR  History of Present Illness:    Gloria Cisneros is a 57 y.o. female with a hx of mitral regurgitation and HTN.  She also has a history of CP with normal stress test, PFO and CVA.  She has had problems with varying formulations of lopressor in the past causing CP and SOB which stopped after she switched companies. She had a coronary CTA in 2018 that showed no CAD and a calcium score of 0.  A nuclear stress test which done a year ago which showed no ischemia.  2D echo a year ago showed normal LV function with EF 60 to 65% with grade 1 diastolic dysfunction and mildly dilated ascending aorta at 40 mm.  She is here today for followup and is doing well.  She continues to have episode CP like she had back in 2018 at which time she had no CAD.  It feels like a pulling sensation and dull ache that comes on if she is bending over for a long period of time and sometimes if she picks something up that is heavy.  There is no associated nausea or diaphoresis with the discomfort.  She goes out walking and only gets the discomfort if she walks up steep hills.  She denies any SOB, DOE, PND, orthopnea, LE edema, dizziness, palpitations or syncope. She is compliant with her meds and is tolerating meds with no SE.    Prior CV studies:   The following studies were reviewed today:  Cardiac MRI 09/2018 FINDINGS: The atria were of normal size. Normal RV size and function Redundant and mobile atrial septum with no obvious PFO noted. Normal LV size and function Quantitative EF 76% (EDV 135 cc ESV 33 cc SV 102 cc)   No significant LVH with septal thickness 11 mm No delayed enhancement or infarct on post gadolinium images. Mild MR No significant AR/AS. Tri leaflet AV.   Aortic MRA: Mild ascending aortic root  dilatation stable 4.1 cm in ascending root. Normal arch vessels. Arch measures 2.4 cm and descending thoracic aorta measures 2.3 cm   IMPRESSION: 1.  Stable ascending aortic root dilatation 4.1 cm   2.  Normal LV size and function EF 76% with no LVH septum 11 mm   3.  No delayed hyper-enhancement on post gadolinium images   4.  Redundant and mobile atrial septum no obvious PFO  2D echo 08/2018 IMPRESSIONS    1. The left ventricle has normal systolic function, with an ejection  fraction of 60-65%. The cavity size was normal. There is moderate  asymmetric left ventricular hypertrophy. Left ventricular diastolic  Doppler parameters are consistent with impaired  relaxation.   2. The right ventricle has normal systolc function. The cavity was  normal. There is no increase in right ventricular wall thickness. Right  ventricular systolic pressure could not be assessed.   3. No stenosis of the aortic valve.   4. There is mild dilatation of the ascending aorta measuring 42 mm. I  have independently reviewed the images from 05/29/2016 echo and 05/11/2017  echo. Independent remeasurement of 05/29/2016 ascending aorta is 41 mm.  Unable to remeasure 2019 study which is  40 mm by image review. Likely no significant change has occured over  serial exams.   Past Medical  History:  Diagnosis Date   Chest pain, non-cardiac    coronary CTA with no CAD and Ca score of 0 in 2018   Dilated aortic root (HCC)    4.1cm by MRI 09/2018 and ascending aorta 4.2cm.   Hyperlipidemia LDL goal <70 06/27/2018   Hypertension    Mitral valve regurgitation    PFO (patent foramen ovale)    by echo 07/2014 but not present on Cardiac MRI 2020   TIA (transient ischemic attack) 07/2014   Past Surgical History:  Procedure Laterality Date   ABDOMINAL HYSTERECTOMY     ABDOMINAL SURGERY     ECTOPIC PREGNANCY SURGERY     INSERTION OF MESH     OOPHORECTOMY       Current Meds  Medication Sig   acetaminophen (TYLENOL)  500 MG tablet Take 500 mg by mouth every 6 (six) hours as needed.   buPROPion (WELLBUTRIN SR) 150 MG 12 hr tablet Take 1 tablet by mouth once daily for 3 days then increase to 1 tablet by mouth twice daily. (Patient taking differently: Take 150 mg by mouth 2 (two) times daily.)   clopidogrel (PLAVIX) 75 MG tablet Take 1 tablet (75 mg total) by mouth daily.   cyclobenzaprine (FLEXERIL) 10 MG tablet Take 1 tablet by mouth 3 (three) times daily as needed for muscle spasms.   loratadine (CLARITIN REDITABS) 10 MG dissolvable tablet Take 10 mg by mouth as needed for allergies.   metoprolol tartrate (LOPRESSOR) 25 MG tablet Take 1 tablet (25 mg total) by mouth 2 (two) times daily.   nitroGLYCERIN (NITROSTAT) 0.4 MG SL tablet Place 1 tablet (0.4 mg total) under the tongue every 5 (five) minutes as needed for chest pain.     Allergies:   Aspirin, Coconut (cocos nucifera), Salvia officinalis, and Atorvastatin   Social History   Tobacco Use   Smoking status: Every Day    Packs/day: 0.25    Types: Cigarettes   Smokeless tobacco: Never   Tobacco comments:    smokes 3 cigarettes a day  Vaping Use   Vaping Use: Former  Substance Use Topics   Alcohol use: Yes    Comment: 0.5 beer every 6-7 months (2 oz roughly)   Drug use: No     Family Hx: The patient's family history includes Cancer in her brother; Hyperlipidemia in her mother; Hypertension in her father.  ROS:   Please see the history of present illness.     All other systems reviewed and are negative.   Labs/Other Tests and Data Reviewed:    Recent Labs: No results found for requested labs within last 365 days.   Recent Lipid Panel Lab Results  Component Value Date/Time   CHOL 152 10/15/2020 08:31 AM   TRIG 219 (H) 10/15/2020 08:31 AM   HDL 28 (L) 10/15/2020 08:31 AM   CHOLHDL 5.4 (H) 10/15/2020 08:31 AM   LDLCALC 87 10/15/2020 08:31 AM    Wt Readings from Last 3 Encounters:  03/06/22 228 lb 6.4 oz (103.6 kg)  11/02/20 225  lb (102.1 kg)  10/14/19 222 lb (100.7 kg)     Objective:    Vital Signs:  BP (!) 148/90   Pulse 70   Ht 5\' 5"  (1.651 m)   Wt 228 lb 6.4 oz (103.6 kg)   SpO2 95%   BMI 38.01 kg/m   GEN: Well nourished, well developed in no acute distress HEENT: Normal NECK: No JVD; No carotid bruits LYMPHATICS: No lymphadenopathy CARDIAC:RRR, no murmurs, rubs, gallops  RESPIRATORY:  Clear to auscultation without rales, wheezing or rhonchi  ABDOMEN: Soft, non-tender, non-distended MUSCULOSKELETAL:  No edema; No deformity  SKIN: Warm and dry NEUROLOGIC:  Alert and oriented x 3 PSYCHIATRIC:  Normal affect   EKG was performed in office today and showed NSR with no ST changes  ASSESSMENT & PLAN:    1.  Mild mitral regurgitation  - no MR was noted on echo 08/2018 and trivial on echo 11/2020  2.  Hypertension  -BP is borderline controlled on exam today but she was stressed this am.   -Increase Lopressor to 50mg  BID -check BP twice daily for a week and call with results  3.  PFO  - this is been noted on prior echoes but on last echo 2019 there was no evidence of PFO by color flow Doppler and right-sided pressures and chamber size were normal.   -no PFO by cardiac MRI 2020 -she does have a history of CVA and has been on Plavix.  4.  Mildly dilated ascending aorta  -2D echo 08/2018 showed an ascending aorta of 49mm in diameter and cardiac MRI showed a diameter of 4.1cm . -Repeat echo 11/2020 acing aorta was 36 mm but poorly visualized -will reassess with coronary CTA  5.  Hyperlipidemia  -LDL goal is < 70 -She was placed on Crestor 5 mg every other day when I last saw her but has not been compliant because she gets very sleepy on statin -Check FLP and ALT -if her LDL is > 100 will start Zetia instead of the statin   6.  Chest pain -coronary CTA showed no CAD and Ca score of 0 in 2018 -she is having CP again that has typical and atypical symptoms>>can occur when walking up hills -it has been  5 years since she had a coronary CTA so I will repeat to make sure she has not developed CAD given her CP  Patient Risk:   After full review of this patient's clinical status, I feel that they are at least moderate risk at this time.  Time:   Today, I have spent 20 minutes on telemedicine discussing medical problems including HTN, dilated aorta, lipids, PFO and reviewing patient's chart including 2D echo and stress test from 2019.  Medication Adjustments/Labs and Tests Ordered: Current medicines are reviewed at length with the patient today.  Concerns regarding medicines are outlined above.  Tests Ordered: Orders Placed This Encounter  Procedures   EKG 12-Lead    Medication Changes: No orders of the defined types were placed in this encounter.    Disposition:  Follow up in 1 year(s)  Signed, Fransico Him, MD  03/06/2022 10:01 AM    [facility]

## 2022-03-07 LAB — LIPID PANEL
Chol/HDL Ratio: 4.1 ratio (ref 0.0–4.4)
Cholesterol, Total: 143 mg/dL (ref 100–199)
HDL: 35 mg/dL — ABNORMAL LOW (ref >39)
LDL Chol Calc (NIH): 85 mg/dL (ref 0–99)
Triglycerides: 127 mg/dL (ref 0–149)
VLDL Cholesterol Cal: 23 mg/dL (ref 5–40)

## 2022-03-07 LAB — BASIC METABOLIC PANEL
BUN/Creatinine Ratio: 11 (ref 9–23)
BUN: 11 mg/dL (ref 6–24)
CO2: 23 mmol/L (ref 20–29)
Calcium: 9.4 mg/dL (ref 8.7–10.2)
Chloride: 105 mmol/L (ref 96–106)
Creatinine, Ser: 1 mg/dL (ref 0.57–1.00)
Glucose: 112 mg/dL — ABNORMAL HIGH (ref 70–99)
Potassium: 4.5 mmol/L (ref 3.5–5.2)
Sodium: 142 mmol/L (ref 134–144)
eGFR: 66 mL/min/{1.73_m2} (ref >59)

## 2022-03-07 LAB — ALT: ALT: 11 IU/L (ref 0–32)

## 2022-04-08 ENCOUNTER — Encounter (HOSPITAL_COMMUNITY): Payer: Self-pay

## 2022-05-18 ENCOUNTER — Encounter (HOSPITAL_COMMUNITY): Payer: Self-pay

## 2022-06-07 ENCOUNTER — Other Ambulatory Visit: Payer: Self-pay

## 2022-06-07 MED ORDER — CLOPIDOGREL BISULFATE 75 MG PO TABS: 75.0000 mg | ORAL_TABLET | Freq: Every day | ORAL | 2 refills | Status: DC

## 2023-03-12 ENCOUNTER — Other Ambulatory Visit: Payer: Self-pay

## 2023-03-12 DIAGNOSIS — I10 Essential (primary) hypertension: Secondary | ICD-10-CM

## 2023-03-12 MED ORDER — CLOPIDOGREL BISULFATE 75 MG PO TABS: 75.0000 mg | ORAL_TABLET | Freq: Every day | ORAL | 0 refills | Status: DC

## 2023-03-12 MED ORDER — METOPROLOL TARTRATE 50 MG PO TABS: 50.0000 mg | ORAL_TABLET | Freq: Two times a day (BID) | ORAL | 0 refills | Status: DC

## 2023-04-16 ENCOUNTER — Other Ambulatory Visit: Payer: Self-pay

## 2023-04-16 DIAGNOSIS — I10 Essential (primary) hypertension: Secondary | ICD-10-CM

## 2023-04-16 MED ORDER — METOPROLOL TARTRATE 50 MG PO TABS: 50.0000 mg | ORAL_TABLET | Freq: Two times a day (BID) | ORAL | 0 refills | Status: DC

## 2023-04-16 MED ORDER — CLOPIDOGREL BISULFATE 75 MG PO TABS: 75.0000 mg | ORAL_TABLET | Freq: Every day | ORAL | 0 refills | Status: DC

## 2023-05-09 ENCOUNTER — Telehealth: Payer: Self-pay

## 2023-05-09 ENCOUNTER — Encounter: Payer: Self-pay | Admitting: Cardiology

## 2023-05-09 ENCOUNTER — Other Ambulatory Visit: Payer: Self-pay

## 2023-05-09 ENCOUNTER — Ambulatory Visit: Payer: Self-pay | Attending: Cardiology | Admitting: Cardiology

## 2023-05-09 VITALS — BP 128/72 | HR 69 | Resp 16 | Ht 65.0 in | Wt 235.2 lb

## 2023-05-09 DIAGNOSIS — I340 Nonrheumatic mitral (valve) insufficiency: Secondary | ICD-10-CM

## 2023-05-09 DIAGNOSIS — R072 Precordial pain: Secondary | ICD-10-CM

## 2023-05-09 DIAGNOSIS — I10 Essential (primary) hypertension: Secondary | ICD-10-CM

## 2023-05-09 DIAGNOSIS — Q2112 Patent foramen ovale: Secondary | ICD-10-CM | POA: Diagnosis not present

## 2023-05-09 DIAGNOSIS — E785 Hyperlipidemia, unspecified: Secondary | ICD-10-CM

## 2023-05-09 DIAGNOSIS — I77810 Thoracic aortic ectasia: Secondary | ICD-10-CM | POA: Diagnosis not present

## 2023-05-09 DIAGNOSIS — Z720 Tobacco use: Secondary | ICD-10-CM

## 2023-05-09 DIAGNOSIS — Z0000 Encounter for general adult medical examination without abnormal findings: Secondary | ICD-10-CM

## 2023-05-09 MED ORDER — ROSUVASTATIN CALCIUM 5 MG PO TABS: 5.0000 mg | ORAL_TABLET | ORAL | 3 refills | Status: AC

## 2023-05-09 MED ORDER — CLOPIDOGREL BISULFATE 75 MG PO TABS: 75.0000 mg | ORAL_TABLET | Freq: Every day | ORAL | 3 refills | Status: AC

## 2023-05-09 MED ORDER — BUPROPION HCL ER (SR) 150 MG PO TB12: ORAL_TABLET | ORAL | 3 refills | Status: AC

## 2023-05-09 MED ORDER — METOPROLOL TARTRATE 50 MG PO TABS: 50.0000 mg | ORAL_TABLET | Freq: Two times a day (BID) | ORAL | 3 refills | Status: AC

## 2023-05-09 NOTE — Patient Instructions (Addendum)
 Medication Instructions:  Your physician has recommended you make the following change in your medication:   1) START bupropion (Wellbutrin) 150 mg daily for 3 days then take twice daily 2) RESTART rosuvastatin (Crestor) 5 mg every other day  *If you need a refill on your cardiac medications before your next appointment, please call your pharmacy*  Lab Work: In 1-2 weeks at [facility]: BMET (for CT scan) In 8 weeks at [facility]: fasting lipid panel and ALT If you have labs (blood work) drawn today and your tests are completely normal, you will receive your results only by: MyChart Message (if you have MyChart) OR A paper copy in the mail If you have any lab test that is abnormal or we need to change your treatment, we will call you to review the results.  Testing/Procedures: Your physician has requested that you have cardiac CT. Cardiac computed tomography (CT) is a painless test that uses an x-ray machine to take clear, detailed pictures of your heart. For further information please visit [email or web address] Please follow instruction sheet as given.  Follow-Up: At [facility], you and your health needs are our priority.  As part of our continuing mission to provide you with exceptional heart care, our providers are all part of one team.  This team includes your primary Cardiologist (physician) and Advanced Practice Providers or APPs (Physician Assistants and Nurse Practitioners) who all work together to provide you with the care you need, when you need it.  Your next appointment:   1 year(s)  Provider:   Armanda Magic, MD    We recommend signing up for the patient portal called "MyChart".  Sign up information is provided on this After Visit Summary.  MyChart is used to connect with patients for Virtual Visits (Telemedicine).  Patients are able to view lab/test results, encounter notes, upcoming appointments, etc.  Non-urgent messages can be sent to your provider as well.   To learn  more about what you can do with MyChart, go to [email or web address].   Other Instructions You have been referred to our Health Coach Amy Nedra Hai to discuss smoking cessation.     Your cardiac CT will be scheduled at:   [facility] [address] [place], [place] [address] [phone]  Please arrive at the [facility] and Children's Entrance (Entrance C2) of [facility] 30 minutes prior to test start time.  You can use the FREE valet parking offered at entrance C (encouraged to control the heart rate for the test).  Proceed to the [facility] Radiology Department (first floor) to check-in and test prep.  All radiology patients and guests should use entrance C2 at [facility], accessed from [facility], even though the hospital's physical address listed is [address]    Please follow these instructions carefully (unless otherwise directed):  An IV will be required for this test and Nitroglycerin will be given.  Hold all erectile dysfunction medications at least 3 days (72 hrs) prior to test. (Ie viagra, cialis, sildenafil, tadalafil, etc)   On the Night Before the Test: Be sure to Drink plenty of water. Do not consume any caffeinated/decaffeinated beverages or chocolate 12 hours prior to your test. Do not take any antihistamines 12 hours prior to your test.  On the Day of the Test: Drink plenty of water until 1 hour prior to the test. Do not eat any food 1 hour prior to test. You may take your regular medications prior to the test.  Take metoprolol (Lopressor) two hours prior to test. Take 2 of your 50 mg tablets 2 hours before CT scan. If you take Furosemide/Hydrochlorothiazide/Spironolactone/Chlorthalidone, please HOLD on the morning of the test. Patients who wear a continuous glucose monitor MUST remove the device prior to scanning. FEMALES- please wear underwire-free bra if available, avoid dresses & tight clothing  After  the Test: Drink plenty of water. After receiving IV contrast, you may experience a mild flushed feeling. This is normal. On occasion, you may experience a mild rash up to 24 hours after the test. This is not dangerous. If this occurs, you can take Benadryl 25 mg, Zyrtec, Claritin, or Allegra and increase your fluid intake. (Patients taking Tikosyn should avoid Benadryl, and may take Zyrtec, Claritin, or Allegra) If you experience trouble breathing, this can be serious. If it is severe call 911 IMMEDIATELY. If it is mild, please call our office.  We will call to schedule your test 2-4 weeks out understanding that some insurance companies will need an authorization prior to the service being performed.   For more information and frequently asked questions, please visit our website : [email or web address]  For non-scheduling related questions, please contact the cardiac imaging nurse navigator should you have any questions/concerns: Cardiac Imaging Nurse Navigators Direct Office Dial: [phone]   For scheduling needs, including cancellations and rescheduling, please call [place], [phone].      1st Floor: - Lobby - Registration  - Pharmacy  - Lab - Cafe  2nd Floor: - PV Lab - Diagnostic Testing (echo, CT, nuclear med)  3rd Floor: - Vacant  4th Floor: - TCTS (cardiothoracic surgery) - AFib Clinic - Structural Heart Clinic - Vascular Surgery  - Vascular Ultrasound  5th Floor: - HeartCare Cardiology (general and EP) - Clinical Pharmacy for coumadin, hypertension, lipid, weight-loss medications, and med management appointments    Valet parking services will be available as well.

## 2023-05-09 NOTE — Progress Notes (Signed)
 Date:  05/09/2023   ID:  Gloria Cisneros, DOB April 01, 1965, MRN [id number]  PCP:  Gloria Mask, MD  Cardiologist:  Gloria Magic, MD Electrophysiologist:  None   Chief Complaint:  Hypertension and MR  History of Present Illness:    Gloria Cisneros is a 58 y.o. female with a hx of mitral regurgitation and HTN.  She also has a history of CP with normal stress test, PFO and CVA.  She has had problems with varying formulations of lopressor in the past causing CP and SOB which stopped after she switched companies. She had a coronary CTA in 2018 that showed no CAD and a calcium score of 0.  A nuclear stress test which done 2019 which showed no ischemia.  2D echo 11/25/2020 showed normal LV function with EF 60 to 65% with grade 1 diastolic dysfunction and mildly dilated ascending aorta at 40 mm.  Cardiac MRI 2020 showed no evidence of PFO and mildly dilated ascending aorta at 4.1 cm.  Coronary calcium score 2019 was 5  She is here today for followup and is doing well.  She continues to have chest pain daily ever since they stopped making the Lopressor she had been taken and now she says it is a different base and does not work. She says that when she had been taking Wellbutrin with the BB her pain goes away.  She thinks that her CP is very anxiety driven.  She continue to smoke 1ppd.  She occasionally will has come DOE if she overexerts with walking.  She occasionally will has some LE edema. She denies any  PND, orthopnea,  palpitations(except when after her exercise) or syncope. She is compliant with her meds and is tolerating meds with no SE.      Prior CV studies:   The following studies were reviewed today:  Cardiac MRI 09/2018 FINDINGS: The atria were of normal size. Normal RV size and function Redundant and mobile atrial septum with no obvious PFO noted. Normal LV size and function Quantitative EF 76% (EDV 135 cc ESV 33 cc SV 102 cc)   No significant LVH with septal thickness 11 mm No  delayed enhancement or infarct on post gadolinium images. Mild MR No significant AR/AS. Tri leaflet AV.   Aortic MRA: Mild ascending aortic root dilatation stable 4.1 cm in ascending root. Normal arch vessels. Arch measures 2.4 cm and descending thoracic aorta measures 2.3 cm   IMPRESSION: 1.  Stable ascending aortic root dilatation 4.1 cm   2.  Normal LV size and function EF 76% with no LVH septum 11 mm   3.  No delayed hyper-enhancement on post gadolinium images   4.  Redundant and mobile atrial septum no obvious PFO  2D echo 08/2018 IMPRESSIONS    1. The left ventricle has normal systolic function, with an ejection  fraction of 60-65%. The cavity size was normal. There is moderate  asymmetric left ventricular hypertrophy. Left ventricular diastolic  Doppler parameters are consistent with impaired  relaxation.   2. The right ventricle has normal systolc function. The cavity was  normal. There is no increase in right ventricular wall thickness. Right  ventricular systolic pressure could not be assessed.   3. No stenosis of the aortic valve.   4. There is mild dilatation of the ascending aorta measuring 42 mm. I  have independently reviewed the images from 05/29/2016 echo and 05/11/2017  echo. Independent remeasurement of 05/29/2016 ascending aorta is 41 mm.  Unable to  remeasure 2019 study which is  40 mm by image review. Likely no significant change has occured over  serial exams.   Past Medical History:  Diagnosis Date   Chest pain, non-cardiac    coronary CTA with no CAD and Ca score of 0 in 2018   Dilated aortic root (HCC)    4.1cm by MRI 09/2018 and ascending aorta 4.2cm.   Hyperlipidemia LDL goal <100 06/27/2018   Hypertension    Mitral valve regurgitation    PFO (patent foramen ovale)    by echo 07/2014 but not present on Cardiac MRI 2020   TIA (transient ischemic attack) 07/2014   Past Surgical History:  Procedure Laterality Date   ABDOMINAL HYSTERECTOMY      ABDOMINAL SURGERY     ECTOPIC PREGNANCY SURGERY     INSERTION OF MESH     OOPHORECTOMY       Current Meds  Medication Sig   acetaminophen (TYLENOL) 500 MG tablet Take 500 mg by mouth every 6 (six) hours as needed.   buPROPion (WELLBUTRIN SR) 150 MG 12 hr tablet Take 1 tablet by mouth once daily for 3 days then increase to 1 tablet by mouth twice daily. (Patient taking differently: Take 150 mg by mouth 2 (two) times daily.)   clopidogrel (PLAVIX) 75 MG tablet Take 1 tablet (75 mg total) by mouth daily.   cyclobenzaprine (FLEXERIL) 10 MG tablet Take 1 tablet by mouth 3 (three) times daily as needed for muscle spasms.   loratadine (CLARITIN REDITABS) 10 MG dissolvable tablet Take 10 mg by mouth as needed for allergies.   metoprolol tartrate (LOPRESSOR) 50 MG tablet Take 1 tablet (50 mg total) by mouth 2 (two) times daily.   nitroGLYCERIN (NITROSTAT) 0.4 MG SL tablet Place 1 tablet (0.4 mg total) under the tongue every 5 (five) minutes as needed for chest pain.     Allergies:   Aspirin, Coconut (cocos nucifera), Salvia officinalis, and Atorvastatin   Social History   Tobacco Use   Smoking status: Every Day    Current packs/day: 0.25    Types: Cigarettes   Smokeless tobacco: Never   Tobacco comments:    smokes 3 cigarettes a day  Vaping Use   Vaping status: Former  Substance Use Topics   Alcohol use: Yes    Comment: 0.5 beer every 6-7 months (2 oz roughly)   Drug use: No     Family Hx: The patient's family history includes Cancer in her brother; Hyperlipidemia in her mother; Hypertension in her father.  ROS:   Please see the history of present illness.     All other systems reviewed and are negative.   Labs/Other Tests and Data Reviewed:    EKG Interpretation Date/Time:  Wednesday May 09 2023 08:32:29 EDT Ventricular Rate:  69 PR Interval:  184 QRS Duration:  84 QT Interval:  378 QTC Calculation: 405 R Axis:   18  Text Interpretation: Normal sinus rhythm Cannot  rule out Anterior infarct , age undetermined When compared with ECG of 05-May-2016 19:10, No significant change was found Confirmed by Gloria Cisneros ([id number]) on 05/09/2023 8:56:19 AM    Recent Labs: No results found for requested labs within last 365 days.   Recent Lipid Panel Lab Results  Component Value Date/Time   CHOL 143 03/06/2022 10:39 AM   TRIG 127 03/06/2022 10:39 AM   HDL 35 (L) 03/06/2022 10:39 AM   CHOLHDL 4.1 03/06/2022 10:39 AM   LDLCALC 85 03/06/2022 10:39 AM  Wt Readings from Last 3 Encounters:  05/09/23 235 lb 3.2 oz (106.7 kg)  03/06/22 228 lb 6.4 oz (103.6 kg)  11/02/20 225 lb (102.1 kg)     Objective:    Vital Signs:  BP 128/72 (BP Location: Left Arm, Patient Position: Sitting, Cuff Size: Large)   Pulse 69   Resp 16   Ht 5\' 5"  (1.651 m)   Wt 235 lb 3.2 oz (106.7 kg)   SpO2 96%   BMI 39.14 kg/m   GEN: Well nourished, well developed in no acute distress HEENT: Normal NECK: No JVD; No carotid bruits LYMPHATICS: No lymphadenopathy CARDIAC:RRR, no murmurs, rubs, gallops RESPIRATORY:  Clear to auscultation without rales, wheezing or rhonchi  ABDOMEN: Soft, non-tender, non-distended MUSCULOSKELETAL:  No edema; No deformity  SKIN: Warm and dry NEUROLOGIC:  Alert and oriented x 3 PSYCHIATRIC:  Normal affect    ASSESSMENT & PLAN:    1.  Mild mitral regurgitation  - no MR was noted on echo 08/2018 and trivial on echo 11/2020  2.  Hypertension  -BP controlled on exam today -Continue drug management with Lopressor 50 g twice daily with as needed refills  3.  PFO  - this is been noted on prior echoes but on last echo 2019 there was no evidence of PFO by color flow Doppler and right-sided pressures and chamber size were normal.   -no PFO by cardiac MRI 2020 -she does have a history of CVA and has been on Plavix.  4.  Mildly dilated ascending aorta -2D echo 08/2018 showed an ascending aorta of 42mm in diameter and cardiac MRI showed a diameter of 4.1cm  . -Repeat echo 11/2020 acing aorta was 36 mm but poorly visualized -Cardiac MRI 2020 ascending aorta measured 4.1 cm -Getting coronary CTA so can reassess at that time  5.  Hyperlipidemia  -LDL goal is < 70 due to history of stroke -she ran out of her Crestor 6 months ago  -restart Crestor 5mg  every other day -Check FLP and ALT in 8 weeks  6.  History of chest pain -coronary CTA showed no CAD and Ca score of 0 in 2018 -Coronary calcium score 2019 was 5 -now having CP again>>she continues to smoke so need to consider progression of CAD.  I suspect that her CP is more related to anxiety because she says that it was better when she was on Wellbutrin  7.  Ongoing tobacco abuse/counseling -she used to take Welbutrin and was able to get off cigarettes but now is back smoking again 1ppd. -will restart Wellbutrin SR >> start with 150mg  daily x 3 days and then 150mg   BID -I will get her in with our smoking cessation program  Medication Adjustments/Labs and Tests Ordered: Current medicines are reviewed at length with the patient today.  Concerns regarding medicines are outlined above.  Tests Ordered: Orders Placed This Encounter  Procedures   EKG 12-Lead    Medication Changes: No orders of the defined types were placed in this encounter.    Disposition:  Follow up in 1 year(s)  Signed, Gloria Magic, MD  05/09/2023 8:46 AM    [facility]

## 2023-05-09 NOTE — Telephone Encounter (Signed)
 Called patient per referral for health coaching from Dr. Mayford Knife for smoking cessation. Patient is currently taking Wellbutrin as a smoking cessation aid and would like additional support with quitting smoking. Patient has been scheduled for her initial health coaching on 4/16 at 10:00am over the phone.  Patient expressed concern about weight gain and described her current eating habits. Encouraged patient to discuss weight loss concerns with her PCP during her upcoming visit.   Renaee Munda, MS, ERHD, [facility]  Care Guide [facility] Heart & Vascular Care Navigation Telephone: [phone] Email: [email or web address]

## 2023-05-09 NOTE — Addendum Note (Signed)
 Addended by: Franchot Gallo on: 05/09/2023 09:11 AM   Modules accepted: Orders

## 2023-05-15 LAB — LAB REPORT - SCANNED
A1c: 6.4
EGFR: 52
HM HIV Screening: NEGATIVE
HM Hepatitis Screen: NEGATIVE

## 2023-05-17 ENCOUNTER — Other Ambulatory Visit: Payer: Self-pay | Admitting: Family Medicine

## 2023-05-17 DIAGNOSIS — Z1231 Encounter for screening mammogram for malignant neoplasm of breast: Secondary | ICD-10-CM

## 2023-05-23 ENCOUNTER — Ambulatory Visit (HOSPITAL_BASED_OUTPATIENT_CLINIC_OR_DEPARTMENT_OTHER)

## 2023-05-24 ENCOUNTER — Telehealth (HOSPITAL_BASED_OUTPATIENT_CLINIC_OR_DEPARTMENT_OTHER): Payer: Self-pay | Admitting: Licensed Clinical Social Worker

## 2023-05-24 NOTE — Telephone Encounter (Signed)
 H&V Care Navigation CSW Progress Note  Clinical Social Worker contacted patient by phone to f/u on health coaching appt from 4/16. I was able to reach her at [phone] and introduced self, role, reason for call. Pt shared she had actually sent a text requesting to cancel the appt and was unaware she was supposed to still be called per schedule.   LCSW let pt know that health coaching is no longer able to be offered due to change in staffing. Offered alternative referrals. Pt requested return call after 10:30am.   Patient is participating in a Managed Medicaid Plan:  No [employer] commercial  SDOH Screenings   Tobacco Use: High Risk (05/09/2023)     Nathen Balder, MSW, LCSW Clinical Social Worker II [facility] Heart/Vascular Care Navigation  [phone]- work cell phone (preferred) [phone]- desk phone

## 2023-05-24 NOTE — Telephone Encounter (Signed)
 H&V Care Navigation CSW Progress Note  Clinical Social Worker contacted patient by phone to f/u as requested after 10:30am for additional referrals as interested. No answer at [phone]. Requested she call me back as needed/interested in community resources.  Patient is participating in a Managed Medicaid Plan:  No, [employer]  SDOH Screenings   Tobacco Use: High Risk (05/09/2023)    Nathen Balder, MSW, LCSW Clinical Social Worker II [facility] Heart/Vascular Care Navigation  [phone]- work cell phone (preferred) [phone]- desk phone

## 2023-05-29 ENCOUNTER — Telehealth (HOSPITAL_COMMUNITY): Payer: Self-pay | Admitting: *Deleted

## 2023-05-29 NOTE — Telephone Encounter (Signed)
 Attempted to call patient regarding upcoming cardiac CT appointment. Left message with spouse for patient to call back. Kerri Peed RN Navigator Cardiac Imaging [facility] [phone] Office

## 2023-05-29 NOTE — Telephone Encounter (Signed)
 Patient returning call about her upcoming cardiac imaging study; pt verbalizes understanding of appt date/time. However patient wishes to cancel her cardiac CT study since she cannot afford the test. I offered the option of a payment plan but patient still declined the test. Will let Dr. Micael Adas know.  Chase Copping RN Navigator Cardiac Imaging [facility] Heart and Vascular [phone] office [phone] cell

## 2023-05-30 ENCOUNTER — Ambulatory Visit (HOSPITAL_COMMUNITY): Admission: RE | Admit: 2023-05-30 | Source: Ambulatory Visit

## 2023-06-07 ENCOUNTER — Ambulatory Visit

## 2024-02-25 ENCOUNTER — Encounter: Payer: Self-pay | Admitting: Pediatrics

## 2024-03-25 ENCOUNTER — Ambulatory Visit: Admitting: Pediatrics
